# Patient Record
Sex: Female | Born: 1975 | Race: Black or African American | Hispanic: No | Marital: Married | Smoking: Never smoker
Health system: Southern US, Community
[De-identification: ages and names within clinical notes are randomized; demographics above are authoritative.]

## PROBLEM LIST (undated history)

## (undated) DIAGNOSIS — D219 Benign neoplasm of connective and other soft tissue, unspecified: Secondary | ICD-10-CM

## (undated) DIAGNOSIS — N63 Unspecified lump in unspecified breast: Secondary | ICD-10-CM

## (undated) DIAGNOSIS — N182 Chronic kidney disease, stage 2 (mild): Secondary | ICD-10-CM

## (undated) DIAGNOSIS — B379 Candidiasis, unspecified: Secondary | ICD-10-CM

## (undated) HISTORY — DX: Chronic kidney disease, stage 2 (mild): N18.2

## (undated) HISTORY — PX: WISDOM TOOTH EXTRACTION: SHX21

## (undated) HISTORY — DX: Candidiasis, unspecified: B37.9

---

## 2007-02-15 ENCOUNTER — Other Ambulatory Visit: Admission: RE | Admit: 2007-02-15 | Discharge: 2007-02-15 | Payer: Self-pay | Admitting: *Deleted

## 2011-08-18 ENCOUNTER — Encounter: Payer: Self-pay | Admitting: Obstetrics and Gynecology

## 2011-08-18 ENCOUNTER — Ambulatory Visit (INDEPENDENT_AMBULATORY_CARE_PROVIDER_SITE_OTHER): Payer: BC Managed Care – PPO | Admitting: Obstetrics and Gynecology

## 2011-08-18 VITALS — BP 92/66 | HR 82 | Ht 62.0 in | Wt 119.0 lb

## 2011-08-18 DIAGNOSIS — R102 Pelvic and perineal pain: Secondary | ICD-10-CM | POA: Insufficient documentation

## 2011-08-18 DIAGNOSIS — Z124 Encounter for screening for malignant neoplasm of cervix: Secondary | ICD-10-CM

## 2011-08-18 DIAGNOSIS — N949 Unspecified condition associated with female genital organs and menstrual cycle: Secondary | ICD-10-CM

## 2011-08-18 DIAGNOSIS — Z319 Encounter for procreative management, unspecified: Secondary | ICD-10-CM | POA: Insufficient documentation

## 2011-08-18 NOTE — Progress Notes (Signed)
Last Pap: 2011 per pt WNL: Yes Regular Periods:yes Contraception: none  Monthly Breast exam:yes Tetanus<104yrs:yes Nl.Bladder Function:yes Daily BMs:yes Healthy Diet:yes Calcium:no Mammogram:no Date of Mammogram: n/a Exercise:no Have often Exercise: n/a Seatbelt: yes Abuse at home: no Stressful work:yes Sigmoid-colonoscopy: n/a Bone Density: No PCP: Pt does not have one at this time Change in PMH: n/a Change in FMH:n/a Pt c/o left side pain before her period.  This pain occurs one to two weeks before her menses.  Pain is 8/10. advil decreases it to a 3/10.  She has also been trying for two years to conceive.  She is in good health.  Her husband had testicular cancer with one dose of chemo.  They have IC three times a week She would like to discuss PCOS and endometriosis.  BP 92/66  Pulse 82  Ht 5\' 2"  (1.575 m)  Wt 119 lb (53.978 kg)  BMI 21.77 kg/m2  LMP 08/02/2011 Pt without complaints Physical Examination: General appearance - alert, well appearing, and in no distress Mental status - normal mood, behavior, speech, dress, motor activity, and thought processes Neck - supple, no significant adenopathy, thyroid exam: thyroid is normal in size without nodules or tenderness Chest - clear to auscultation, no wheezes, rales or rhonchi, symmetric air entry Heart - normal rate and regular rhythm Abdomen - soft, nontender, nondistended, no masses or organomegaly Breasts - breasts appear normal, no suspicious masses, no skin or nipple changes or axillary nodes Pelvic - normal external genitalia, vulva, vagina, cervix, uterus and adnexa Rectal - normal rectal, no masses, rectal exam not indicated Back exam - full range of motion, no tenderness, palpable spasm or pain on motion Neurological - alert, oriented, normal speech, no focal findings or movement disorder noted Musculoskeletal - no joint tenderness, deformity or swelling Extremities - no edema, redness or tenderness in the calves  or thighs Skin - normal coloration and turgor, no rashes, no suspicious skin lesions noted Routine exam Pap sent yes with cultures Mammogram due n0 RT for shg/femvue.  Pt declined HSG.  Check day 21 labs.  Semen analysis ordered.  PNV samples given.   On day 21 check fasting blood sugars, insulin, tsh, prl and progesterone Pt agrees with the plan

## 2011-08-18 NOTE — Patient Instructions (Signed)
Call with menses to schedule sonohysterogram and Femvue Schedule fasting day 21 labs Give pt information on HSG

## 2011-08-19 LAB — PAP IG W/ RFLX HPV ASCU

## 2011-09-09 ENCOUNTER — Telehealth: Payer: Self-pay | Admitting: Obstetrics and Gynecology

## 2011-09-09 NOTE — Telephone Encounter (Signed)
Niccole/ND pt/appt req.

## 2014-10-23 ENCOUNTER — Other Ambulatory Visit (HOSPITAL_COMMUNITY)
Admission: RE | Admit: 2014-10-23 | Discharge: 2014-10-23 | Disposition: A | Discharge status: Home / Self Care | Payer: BLUE CROSS/BLUE SHIELD | Source: Ambulatory Visit | Attending: Obstetrics and Gynecology | Admitting: Obstetrics and Gynecology

## 2014-10-23 DIAGNOSIS — Z1151 Encounter for screening for human papillomavirus (HPV): Secondary | ICD-10-CM | POA: Diagnosis not present

## 2014-10-23 DIAGNOSIS — Z01419 Encounter for gynecological examination (general) (routine) without abnormal findings: Secondary | ICD-10-CM | POA: Diagnosis present

## 2015-01-14 NOTE — L&D Delivery Note (Signed)
Delivery Note At  a viable female was delivered via  vtx presentation..  APGAR:8 ,9 ; Placenta status: intact , .  3VCord:  .    Anesthesia:  Lidocaine Episiotomy:  None Lacerations:  1st degree, left labial, periurethral Suture Repair: vicryl Est. Blood Loss (mL):  100cc  Mom to postpartum.  Baby to Couplet care / Skin to Skin.  Kelly Suarez Kelly Suarez 10/23/2015, 9:24 PM

## 2015-03-29 LAB — OB RESULTS CONSOLE HEPATITIS B SURFACE ANTIGEN: Hepatitis B Surface Ag: NEGATIVE

## 2015-03-29 LAB — OB RESULTS CONSOLE HIV ANTIBODY (ROUTINE TESTING): HIV: NONREACTIVE

## 2015-03-29 LAB — OB RESULTS CONSOLE ANTIBODY SCREEN: Antibody Screen: NEGATIVE

## 2015-03-29 LAB — OB RESULTS CONSOLE RPR: RPR: NONREACTIVE

## 2015-03-29 LAB — OB RESULTS CONSOLE RUBELLA ANTIBODY, IGM: Rubella: IMMUNE

## 2015-03-29 LAB — OB RESULTS CONSOLE ABO/RH: RH Type: POSITIVE

## 2015-04-04 ENCOUNTER — Other Ambulatory Visit: Payer: Self-pay | Admitting: Obstetrics and Gynecology

## 2015-04-04 DIAGNOSIS — N6320 Unspecified lump in the left breast, unspecified quadrant: Secondary | ICD-10-CM

## 2015-04-04 DIAGNOSIS — O09522 Supervision of elderly multigravida, second trimester: Secondary | ICD-10-CM

## 2015-04-04 DIAGNOSIS — Z3689 Encounter for other specified antenatal screening: Secondary | ICD-10-CM

## 2015-04-09 ENCOUNTER — Ambulatory Visit
Admission: RE | Admit: 2015-04-09 | Discharge: 2015-04-09 | Disposition: A | Discharge status: Home / Self Care | Payer: BLUE CROSS/BLUE SHIELD | Source: Ambulatory Visit | Attending: Obstetrics and Gynecology | Admitting: Obstetrics and Gynecology

## 2015-04-09 DIAGNOSIS — N6320 Unspecified lump in the left breast, unspecified quadrant: Secondary | ICD-10-CM

## 2015-05-22 ENCOUNTER — Encounter (HOSPITAL_COMMUNITY): Payer: Self-pay | Admitting: Obstetrics and Gynecology

## 2015-06-01 ENCOUNTER — Encounter (HOSPITAL_COMMUNITY): Payer: Self-pay

## 2015-06-04 ENCOUNTER — Ambulatory Visit (HOSPITAL_COMMUNITY)
Admission: RE | Admit: 2015-06-04 | Discharge: 2015-06-04 | Disposition: A | Discharge status: Home / Self Care | Payer: BLUE CROSS/BLUE SHIELD | Source: Ambulatory Visit | Attending: Obstetrics and Gynecology | Admitting: Obstetrics and Gynecology

## 2015-06-04 ENCOUNTER — Other Ambulatory Visit: Payer: Self-pay | Admitting: Obstetrics and Gynecology

## 2015-06-04 ENCOUNTER — Other Ambulatory Visit (HOSPITAL_COMMUNITY): Payer: Self-pay | Admitting: *Deleted

## 2015-06-04 ENCOUNTER — Encounter (HOSPITAL_COMMUNITY): Payer: Self-pay

## 2015-06-04 DIAGNOSIS — O09522 Supervision of elderly multigravida, second trimester: Secondary | ICD-10-CM

## 2015-06-04 DIAGNOSIS — O09512 Supervision of elderly primigravida, second trimester: Secondary | ICD-10-CM | POA: Diagnosis present

## 2015-06-04 DIAGNOSIS — Z3A19 19 weeks gestation of pregnancy: Secondary | ICD-10-CM | POA: Diagnosis not present

## 2015-06-04 DIAGNOSIS — Z36 Encounter for antenatal screening of mother: Secondary | ICD-10-CM | POA: Insufficient documentation

## 2015-06-04 DIAGNOSIS — Z3689 Encounter for other specified antenatal screening: Secondary | ICD-10-CM

## 2015-06-04 DIAGNOSIS — O09519 Supervision of elderly primigravida, unspecified trimester: Secondary | ICD-10-CM

## 2015-07-03 ENCOUNTER — Ambulatory Visit (HOSPITAL_COMMUNITY): Payer: BLUE CROSS/BLUE SHIELD

## 2015-07-05 ENCOUNTER — Encounter (HOSPITAL_COMMUNITY): Payer: Self-pay

## 2015-07-05 ENCOUNTER — Other Ambulatory Visit (HOSPITAL_COMMUNITY): Payer: Self-pay | Admitting: Obstetrics and Gynecology

## 2015-07-05 ENCOUNTER — Ambulatory Visit (HOSPITAL_COMMUNITY)
Admission: RE | Admit: 2015-07-05 | Discharge: 2015-07-05 | Disposition: A | Discharge status: Home / Self Care | Payer: BLUE CROSS/BLUE SHIELD | Source: Ambulatory Visit | Attending: Obstetrics and Gynecology | Admitting: Obstetrics and Gynecology

## 2015-07-05 DIAGNOSIS — O09512 Supervision of elderly primigravida, second trimester: Secondary | ICD-10-CM | POA: Insufficient documentation

## 2015-07-05 DIAGNOSIS — Z36 Encounter for antenatal screening of mother: Secondary | ICD-10-CM | POA: Diagnosis not present

## 2015-07-05 DIAGNOSIS — Z3A23 23 weeks gestation of pregnancy: Secondary | ICD-10-CM | POA: Insufficient documentation

## 2015-07-05 DIAGNOSIS — O09519 Supervision of elderly primigravida, unspecified trimester: Secondary | ICD-10-CM

## 2015-08-02 ENCOUNTER — Ambulatory Visit (HOSPITAL_COMMUNITY): Payer: BLUE CROSS/BLUE SHIELD

## 2015-08-07 ENCOUNTER — Encounter (HOSPITAL_COMMUNITY): Payer: Self-pay

## 2015-08-08 ENCOUNTER — Ambulatory Visit (HOSPITAL_COMMUNITY)
Admission: RE | Admit: 2015-08-08 | Payer: BLUE CROSS/BLUE SHIELD | Source: Ambulatory Visit | Attending: Obstetrics and Gynecology | Admitting: Obstetrics and Gynecology

## 2015-08-30 ENCOUNTER — Ambulatory Visit (HOSPITAL_COMMUNITY): Admission: RE | Admit: 2015-08-30 | Payer: BLUE CROSS/BLUE SHIELD | Source: Ambulatory Visit

## 2015-09-21 ENCOUNTER — Observation Stay (HOSPITAL_COMMUNITY): Payer: BLUE CROSS/BLUE SHIELD

## 2015-09-21 ENCOUNTER — Observation Stay (HOSPITAL_COMMUNITY)
Admission: AD | Admit: 2015-09-21 | Discharge: 2015-09-22 | Disposition: A | Discharge status: Home / Self Care | Payer: BLUE CROSS/BLUE SHIELD | Source: Ambulatory Visit | Attending: Obstetrics and Gynecology | Admitting: Obstetrics and Gynecology

## 2015-09-21 ENCOUNTER — Encounter (HOSPITAL_COMMUNITY): Payer: Self-pay | Admitting: *Deleted

## 2015-09-21 ENCOUNTER — Other Ambulatory Visit: Payer: Self-pay | Admitting: Obstetrics and Gynecology

## 2015-09-21 DIAGNOSIS — O4693 Antepartum hemorrhage, unspecified, third trimester: Secondary | ICD-10-CM

## 2015-09-21 DIAGNOSIS — O4690 Antepartum hemorrhage, unspecified, unspecified trimester: Secondary | ICD-10-CM | POA: Diagnosis present

## 2015-09-21 DIAGNOSIS — Z3A34 34 weeks gestation of pregnancy: Secondary | ICD-10-CM | POA: Insufficient documentation

## 2015-09-21 LAB — URINALYSIS, ROUTINE W REFLEX MICROSCOPIC
BILIRUBIN URINE: NEGATIVE
GLUCOSE, UA: 250 mg/dL — AB
KETONES UR: NEGATIVE mg/dL
Nitrite: NEGATIVE
PH: 5.5 (ref 5.0–8.0)
PROTEIN: NEGATIVE mg/dL
Specific Gravity, Urine: 1.015 (ref 1.005–1.030)

## 2015-09-21 LAB — TYPE AND SCREEN
ABO/RH(D): B POS
Antibody Screen: NEGATIVE

## 2015-09-21 LAB — URINE MICROSCOPIC-ADD ON

## 2015-09-21 LAB — CBC
HEMATOCRIT: 37.4 % (ref 36.0–46.0)
Hemoglobin: 12.8 g/dL (ref 12.0–15.0)
MCH: 31.3 pg (ref 26.0–34.0)
MCHC: 34.2 g/dL (ref 30.0–36.0)
MCV: 91.4 fL (ref 78.0–100.0)
PLATELETS: 211 10*3/uL (ref 150–400)
RBC: 4.09 MIL/uL (ref 3.87–5.11)
RDW: 13.8 % (ref 11.5–15.5)
WBC: 8.4 10*3/uL (ref 4.0–10.5)

## 2015-09-21 LAB — GLUCOSE, CAPILLARY: GLUCOSE-CAPILLARY: 120 mg/dL — AB (ref 65–99)

## 2015-09-21 LAB — ABO/RH: ABO/RH(D): B POS

## 2015-09-21 MED ORDER — DOCUSATE SODIUM 100 MG PO CAPS
100.0000 mg | ORAL_CAPSULE | Freq: Every day | ORAL | Status: DC
  Administered 2015-09-22: 100 mg via ORAL
  Filled 2015-09-21: qty 1

## 2015-09-21 MED ORDER — CALCIUM CARBONATE ANTACID 500 MG PO CHEW: 2.0000 | CHEWABLE_TABLET | ORAL | Status: DC | PRN

## 2015-09-21 MED ORDER — BETAMETHASONE SOD PHOS & ACET 6 (3-3) MG/ML IJ SUSP
12.0000 mg | INTRAMUSCULAR | Status: AC
  Administered 2015-09-21 – 2015-09-22 (×2): 12 mg via INTRAMUSCULAR
  Filled 2015-09-21 (×2): qty 2

## 2015-09-21 MED ORDER — ACETAMINOPHEN 325 MG PO TABS: 650.0000 mg | ORAL_TABLET | ORAL | Status: DC | PRN

## 2015-09-21 MED ORDER — LACTATED RINGERS IV SOLN
INTRAVENOUS | Status: DC
  Administered 2015-09-21: 125 mL/h via INTRAVENOUS
  Administered 2015-09-22: 12:00:00 via INTRAVENOUS

## 2015-09-21 MED ORDER — ZOLPIDEM TARTRATE 5 MG PO TABS: 5.0000 mg | ORAL_TABLET | Freq: Every evening | ORAL | Status: DC | PRN

## 2015-09-21 MED ORDER — PRENATAL MULTIVITAMIN CH
1.0000 | ORAL_TABLET | Freq: Every day | ORAL | Status: DC
  Administered 2015-09-21: 1 via ORAL
  Filled 2015-09-21 (×2): qty 1

## 2015-09-21 MED ORDER — NIFEDIPINE 10 MG PO CAPS
10.0000 mg | ORAL_CAPSULE | Freq: Four times a day (QID) | ORAL | Status: DC | PRN
  Administered 2015-09-21 – 2015-09-22 (×2): 10 mg via ORAL
  Filled 2015-09-21 (×2): qty 1

## 2015-09-21 MED ORDER — NIFEDIPINE 10 MG PO CAPS: 10.0000 mg | ORAL_CAPSULE | Freq: Four times a day (QID) | ORAL | Status: DC

## 2015-09-21 MED ORDER — LACTATED RINGERS IV BOLUS (SEPSIS)
500.0000 mL | Freq: Once | INTRAVENOUS | Status: AC
  Administered 2015-09-21: 500 mL via INTRAVENOUS

## 2015-09-21 MED ORDER — NIFEDIPINE 10 MG PO CAPS: 20.0000 mg | ORAL_CAPSULE | Freq: Once | ORAL | Status: DC | PRN

## 2015-09-21 NOTE — H&P (Addendum)
Kelly Suarez is a 40 y.o. female G1 @ 44 5/7 weeks revised by a 9 week ultrasound admitted for presumed preterm labor.  Pt called the office this morning stating she had some spotting since about 4am. When she checked at 7 am she was still spotting but it was not as much at it was at 4am. Pt states that she has not had sex recently and baby is moving good.   Pt reports pelvic pressure but denies contractions.  Pt now states that when she goes to the bathroom in the hospital, she feels pain in the anterior to fundal portion of her uterus but it does not hurt on palpation. PNC has been complicated by preterm contractions in the second trimester.  Cervical length was normal.  Large uterine fibroids x 4.  Largest is anterior fundal at 10 cm, others are 4-5 cm x 2 and 2 cm.  Anterior fibroid noted in LUS. Pt is a Jehovah's Witness and refuses all blood products.  She has not had anemia this pregnancy.  OB History    Gravida Para Term Preterm AB Living   1 0 0 0 0 0   SAB TAB Ectopic Multiple Live Births   0 0 0 0 0     Past Medical History:  Diagnosis Date  . Yeast infection    Past Surgical History:  Procedure Laterality Date  . WISDOM TOOTH EXTRACTION     Family History: family history includes Asthma in her mother; Cancer in her maternal grandfather; Heart disease in her mother. Social History:  reports that she has never smoked. She has never used smokeless tobacco. She reports that she drinks about 0.5 oz of alcohol per week . She reports that she does not use drugs.     Maternal Diabetes: No Genetic Screening: Declined Maternal Ultrasounds/Referrals: Abnormal:  Findings:   Other: Large fibroids Fetal Ultrasounds or other Referrals:  Referred to Materal Fetal Medicine  Maternal Substance Abuse:  No Significant Maternal Medications:  None Significant Maternal Lab Results:  None Other Comments:  Jehovah's Witness  Review of Systems  Constitutional: Negative for chills and fever.    Maternal Medical History:  Reason for admission: Contractions and vaginal bleeding.   Contractions: Frequency: irregular.   Pt does feel contractions just pressure  Fetal activity: Perceived fetal activity is normal.    Prenatal complications: Preterm contractions in second trimester.Fibroids. Extreme AMA.  Prenatal Complications - Diabetes: none.      Blood pressure 107/66, pulse 90, temperature 98.1 F (36.7 C), temperature source Oral, resp. rate 20, height 5\' 2"  (1.575 m), weight 68 kg (150 lb). Maternal Exam:  Uterine Assessment: Contraction frequency is irregular.   Abdomen: Patient reports no abdominal tenderness. Estimated fetal weight is 8/31- 4 lbs 14 oz +/- 8 oz (2225 grams) 58 %.   Fetal presentation: vertex  Introitus: Normal vulva. Large blood clot/mucus plug.  No active cervical bleeding  Pelvis: adequate for delivery.   Cervix: Cervix evaluated by digital exam.   1-2/50/-3, engaged, soft, midposition  Fetal Exam Fetal Monitor Review: Variability: moderate (6-25 bpm).   Pattern: accelerations present and variable decelerations.    Fetal State Assessment: Category II - tracings are indeterminate.     Physical Exam  Constitutional: She is oriented to person, place, and time. She appears well-developed and well-nourished.  HENT:  Head: Normocephalic and atraumatic.  Eyes: EOM are normal.  Neck: Normal range of motion.  Respiratory: Effort normal.  Musculoskeletal: Normal range of motion. She exhibits  no edema or tenderness.  Neurological: She is alert and oriented to person, place, and time.  Skin: Skin is warm and dry.  Psychiatric: She has a normal mood and affect.    Prenatal labs: ABO, Rh: --/--/B POS, B POS (09/08 1440) Antibody: NEG (09/08 1440) Rubella: Immune (03/16 0000) RPR: Nonreactive (03/16 0000)  HBsAg: Negative (03/16 0000)  HIV: Non-reactive (03/16 0000)  GBS:   Pending  Urinalysis    Component Value Date/Time   COLORURINE  YELLOW 09/21/2015 [place] 09/21/2015 1545   LABSPEC 1.015 09/21/2015 1545   PHURINE 5.5 09/21/2015 1545   GLUCOSEU 250 (A) 09/21/2015 1545   HGBUR TRACE (A) 09/21/2015 1545   BILIRUBINUR NEGATIVE 09/21/2015 [place] 09/21/2015 1545   PROTEINUR NEGATIVE 09/21/2015 1545   NITRITE NEGATIVE 09/21/2015 1545   LEUKOCYTESUR SMALL (A) 09/21/2015 1545     Assessment/Plan: IUP @ 34 5/7 weeks Category II tracing overall reassuring. Presumed PTL. Vaginal bleeding. Glucosuria- Fibroids Extreme AMA. Jehovah's Witness.  Normal hemoglobin.  Pt admitted for 24 hour observation.  IVF bolus.  S/p Procardia. BMZ administered at 1711. Continue Procardia 10 mg q 6 prn contractions.   BPP, assess fluid. GBS by PCR ordered. CBG 120 with meal.  Repeat 2 hours postprandial.  If contractions decreased, cervix unchanged discharge home with Procardia after 2nd Bethamethasone injection.  Pt previously counseled on risk of bleeding with vaginal delivery or cesarean section.  Understands risk of death if hemorrhage with severe anemia requiring a blood transfusion.  Pt has been well counseled by her clergy and verbalizes understanding.  CCOB to cover this weekend.  Dr. Raphael Gibney assuming care at 7 pm.     Thurnell Lose 09/21/2015, 6:37 PM   Addendum: [place] after admission:  Vtx, EFW 5+14, 74%ile, AFI 14.74, 53%ile, multiple fibroids, BPP 8/8.  Davis Gourd 09/22/15 7:15a

## 2015-09-22 LAB — OB RESULTS CONSOLE GC/CHLAMYDIA
Chlamydia: NEGATIVE
GC PROBE AMP, GENITAL: NEGATIVE

## 2015-09-22 LAB — GROUP B STREP BY PCR: Group B strep by PCR: NEGATIVE

## 2015-09-22 LAB — OB RESULTS CONSOLE GBS: STREP GROUP B AG: NEGATIVE

## 2015-09-22 MED ORDER — NIFEDIPINE 10 MG PO CAPS: 10.0000 mg | ORAL_CAPSULE | Freq: Four times a day (QID) | ORAL | 1 refills | Status: DC

## 2015-09-22 MED ORDER — NIFEDIPINE 10 MG PO CAPS
10.0000 mg | ORAL_CAPSULE | Freq: Four times a day (QID) | ORAL | Status: DC
  Administered 2015-09-22 (×2): 10 mg via ORAL
  Filled 2015-09-22 (×2): qty 1

## 2015-09-22 NOTE — Discharge Summary (Signed)
  ANTENATAL DISCHARGE SUMMARY  Patient ID: Kelly Suarez MRN: [id number] DOB/AGE: 41/07/1975 41 y.o.  Admit date: 09/21/2015 Discharge date: 09/22/2015  Admission Diagnoses: 34+6 weeks, large fibroids, preterm labor  Discharge Diagnoses: same with 2nd bleeding         Discharged Condition: good  Hospital Course: Admitted 09/21/15, given BMZ x2 and Procardia every 6 hours. Bleeding stopped rapidly and perceived contractions resolved. Cervical exam upon discharge 1/50/balottable and posterior   Disposition: home. Follow-up appointment 09/25/15 at [facility]    Medication List    TAKE these medications   NIFEdipine 10 MG capsule Commonly known as:  PROCARDIA Take 1 capsule (10 mg total) by mouth every 6 (six) hours.   prenatal vitamin w/FE, FA 27-1 MG Tabs tablet Take 1 tablet by mouth every evening.        Signed: Alwyn Pea, MD MD 09/22/2015, 6:16 PM

## 2015-09-22 NOTE — Progress Notes (Signed)
Patient resting at this time. Per MD orders, patient vitals not obtained at this time. Gildardo Cranker, RN

## 2015-09-22 NOTE — Discharge Instructions (Signed)
Preterm Labor Information Preterm labor is when labor starts before you are [redacted] weeks pregnant. The normal length of pregnancy is 39 to 41 weeks.  CAUSES  The cause of preterm labor is not often known. The most common known cause is infection. RISK FACTORS  Having a history of preterm labor.  Having your water break before it should.  Having a placenta that covers the opening of the cervix.  Having a placenta that breaks away from the uterus.  Having a cervix that is too weak to hold the baby in the uterus.  Having too much fluid in the amniotic sac.  Taking drugs or smoking while pregnant.  Not gaining enough weight while pregnant.  Being younger than 38 and older than 41 years old.  Having a low income.  Being African American. SYMPTOMS  Period-like cramps, belly (abdominal) pain, or back pain.  Contractions that are regular, as often as six in an hour. They may be mild or painful.  Contractions that start at the top of the belly. They then move to the lower belly and back.  Lower belly pressure that seems to get stronger.  Bleeding from the vagina.  Fluid leaking from the vagina. TREATMENT  Treatment depends on:  Your condition.  The condition of your baby.  How many weeks pregnant you are. Your doctor may have you:  Take medicine to stop contractions.  Stay in bed except to use the restroom (bed rest).  Stay in the hospital. WHAT SHOULD YOU DO IF YOU THINK YOU ARE IN PRETERM LABOR? Call your doctor right away. You need to go to the hospital right away.  HOW CAN YOU PREVENT PRETERM LABOR IN FUTURE PREGNANCIES?  Stop smoking, if you smoke.  Maintain healthy weight gain.  Do not take drugs or be around chemicals that are not needed.  Tell your doctor if you think you have an infection.  Tell your doctor if you had a preterm labor before.   This information is not intended to replace advice given to you by your health care provider. Make sure you  discuss any questions you have with your health care provider.   Document Released: 03/28/2008 Document Revised: 05/16/2014 Document Reviewed: 02/02/2012 Elsevier Interactive Patient Education [employer].

## 2015-09-22 NOTE — Progress Notes (Signed)
Hospital day # 1 pregnancy at [redacted]w[redacted]d with PTL / large fibroids  S: well, reports good fetal activity      Contractions:none perceived by patient but every 2-4 minutes on toco. Patient reports great improvement compared to yesterday with resolution of bleeding and pelvic pressure.      Vaginal bleeding:none now       Vaginal discharge: no significant change  O: BP 102/65 (BP Location: Left Arm)   Pulse 70   Temp 97.7 F (36.5 C) (Oral)   Resp 16   Ht 5\' 2"  (1.575 m)   Wt 150 lb (68 kg)   BMI 27.44 kg/m       Fetal tracings:reviewed and reassuring      Uterus gravid and non-tender      Extremities: no significant edema and no signs of DVT  A: [redacted]w[redacted]d with PTL: improved      Large uterine fibroids     P: continue current plan of care     Change Procardia to every 6 hours. BMZ#2 at 17:00  Akosua Constantine A  MD 09/22/2015 10:24 AM

## 2015-09-27 ENCOUNTER — Ambulatory Visit (HOSPITAL_COMMUNITY): Admission: RE | Admit: 2015-09-27 | Payer: BLUE CROSS/BLUE SHIELD | Source: Ambulatory Visit

## 2015-10-23 ENCOUNTER — Inpatient Hospital Stay (HOSPITAL_COMMUNITY)
Admission: AD | Admit: 2015-10-23 | Discharge: 2015-10-25 | DRG: 775 | Disposition: A | Discharge status: Home / Self Care | Payer: BLUE CROSS/BLUE SHIELD | Source: Ambulatory Visit | Attending: Obstetrics and Gynecology | Admitting: Obstetrics and Gynecology

## 2015-10-23 ENCOUNTER — Encounter (HOSPITAL_COMMUNITY): Payer: Self-pay | Admitting: *Deleted

## 2015-10-23 DIAGNOSIS — Z8249 Family history of ischemic heart disease and other diseases of the circulatory system: Secondary | ICD-10-CM | POA: Diagnosis not present

## 2015-10-23 DIAGNOSIS — O3413 Maternal care for benign tumor of corpus uteri, third trimester: Secondary | ICD-10-CM | POA: Diagnosis present

## 2015-10-23 DIAGNOSIS — Z3A39 39 weeks gestation of pregnancy: Secondary | ICD-10-CM

## 2015-10-23 DIAGNOSIS — D259 Leiomyoma of uterus, unspecified: Secondary | ICD-10-CM | POA: Diagnosis present

## 2015-10-23 DIAGNOSIS — O09513 Supervision of elderly primigravida, third trimester: Secondary | ICD-10-CM

## 2015-10-23 DIAGNOSIS — Z3403 Encounter for supervision of normal first pregnancy, third trimester: Secondary | ICD-10-CM | POA: Diagnosis present

## 2015-10-23 HISTORY — DX: Unspecified lump in unspecified breast: N63.0

## 2015-10-23 HISTORY — DX: Benign neoplasm of connective and other soft tissue, unspecified: D21.9

## 2015-10-23 LAB — TYPE AND SCREEN
ABO/RH(D): B POS
Antibody Screen: NEGATIVE

## 2015-10-23 LAB — CBC
HCT: 39.2 % (ref 36.0–46.0)
Hemoglobin: 13.8 g/dL (ref 12.0–15.0)
MCH: 31.9 pg (ref 26.0–34.0)
MCHC: 35.2 g/dL (ref 30.0–36.0)
MCV: 90.7 fL (ref 78.0–100.0)
PLATELETS: 199 10*3/uL (ref 150–400)
RBC: 4.32 MIL/uL (ref 3.87–5.11)
RDW: 13.5 % (ref 11.5–15.5)
WBC: 9.4 10*3/uL (ref 4.0–10.5)

## 2015-10-23 MED ORDER — OXYCODONE-ACETAMINOPHEN 5-325 MG PO TABS: 2.0000 | ORAL_TABLET | ORAL | Status: DC | PRN

## 2015-10-23 MED ORDER — OXYCODONE-ACETAMINOPHEN 5-325 MG PO TABS: 1.0000 | ORAL_TABLET | ORAL | Status: DC | PRN

## 2015-10-23 MED ORDER — PHENYLEPHRINE 40 MCG/ML (10ML) SYRINGE FOR IV PUSH (FOR BLOOD PRESSURE SUPPORT)
80.0000 ug | PREFILLED_SYRINGE | INTRAVENOUS | Status: DC | PRN
  Filled 2015-10-23: qty 5

## 2015-10-23 MED ORDER — DIPHENHYDRAMINE HCL 50 MG/ML IJ SOLN: 12.5000 mg | INTRAMUSCULAR | Status: DC | PRN

## 2015-10-23 MED ORDER — OXYTOCIN BOLUS FROM INFUSION
500.0000 mL | Freq: Once | INTRAVENOUS | Status: AC
  Administered 2015-10-23: 500 mL/h via INTRAVENOUS

## 2015-10-23 MED ORDER — OXYTOCIN 40 UNITS IN LACTATED RINGERS INFUSION - SIMPLE MED
2.5000 [IU]/h | INTRAVENOUS | Status: DC
  Administered 2015-10-23: 2.5 [IU]/h via INTRAVENOUS

## 2015-10-23 MED ORDER — ONDANSETRON HCL 4 MG PO TABS: 4.0000 mg | ORAL_TABLET | ORAL | Status: DC | PRN

## 2015-10-23 MED ORDER — LACTATED RINGERS IV SOLN: 500.0000 mL | Freq: Once | INTRAVENOUS | Status: DC

## 2015-10-23 MED ORDER — PRENATAL PLUS 27-1 MG PO TABS: 1.0000 | ORAL_TABLET | Freq: Every evening | ORAL | Status: DC

## 2015-10-23 MED ORDER — SENNOSIDES-DOCUSATE SODIUM 8.6-50 MG PO TABS
2.0000 | ORAL_TABLET | ORAL | Status: DC
  Administered 2015-10-24 (×2): 2 via ORAL
  Filled 2015-10-23 (×2): qty 2

## 2015-10-23 MED ORDER — SIMETHICONE 80 MG PO CHEW: 80.0000 mg | CHEWABLE_TABLET | ORAL | Status: DC | PRN

## 2015-10-23 MED ORDER — EPHEDRINE 5 MG/ML INJ
10.0000 mg | INTRAVENOUS | Status: DC | PRN
  Filled 2015-10-23: qty 4

## 2015-10-23 MED ORDER — TETANUS-DIPHTH-ACELL PERTUSSIS 5-2.5-18.5 LF-MCG/0.5 IM SUSP
0.5000 mL | Freq: Once | INTRAMUSCULAR | Status: AC
  Administered 2015-10-24: 0.5 mL via INTRAMUSCULAR

## 2015-10-23 MED ORDER — ONDANSETRON HCL 4 MG/2ML IJ SOLN: 4.0000 mg | INTRAMUSCULAR | Status: DC | PRN

## 2015-10-23 MED ORDER — OXYTOCIN 40 UNITS IN LACTATED RINGERS INFUSION - SIMPLE MED: 1.0000 m[IU]/min | INTRAVENOUS | Status: DC

## 2015-10-23 MED ORDER — LACTATED RINGERS IV SOLN
INTRAVENOUS | Status: DC
  Administered 2015-10-23 (×3): via INTRAVENOUS

## 2015-10-23 MED ORDER — DIPHENHYDRAMINE HCL 25 MG PO CAPS: 25.0000 mg | ORAL_CAPSULE | Freq: Four times a day (QID) | ORAL | Status: DC | PRN

## 2015-10-23 MED ORDER — SOD CITRATE-CITRIC ACID 500-334 MG/5ML PO SOLN: 30.0000 mL | ORAL | Status: DC | PRN

## 2015-10-23 MED ORDER — COCONUT OIL OIL: 1.0000 "application " | TOPICAL_OIL | Status: DC | PRN

## 2015-10-23 MED ORDER — FENTANYL CITRATE (PF) 100 MCG/2ML IJ SOLN: 50.0000 ug | INTRAMUSCULAR | Status: DC | PRN

## 2015-10-23 MED ORDER — BENZOCAINE-MENTHOL 20-0.5 % EX AERO: 1.0000 "application " | INHALATION_SPRAY | CUTANEOUS | Status: DC | PRN

## 2015-10-23 MED ORDER — LACTATED RINGERS IV SOLN: INTRAVENOUS | Status: DC

## 2015-10-23 MED ORDER — ACETAMINOPHEN 325 MG PO TABS
650.0000 mg | ORAL_TABLET | ORAL | Status: DC | PRN
  Filled 2015-10-23 (×2): qty 2

## 2015-10-23 MED ORDER — ONDANSETRON HCL 4 MG/2ML IJ SOLN: 4.0000 mg | Freq: Four times a day (QID) | INTRAMUSCULAR | Status: DC | PRN

## 2015-10-23 MED ORDER — TERBUTALINE SULFATE 1 MG/ML IJ SOLN
0.2500 mg | Freq: Once | INTRAMUSCULAR | Status: DC | PRN
  Filled 2015-10-23: qty 1

## 2015-10-23 MED ORDER — FENTANYL 2.5 MCG/ML BUPIVACAINE 1/10 % EPIDURAL INFUSION (WH - ANES): 14.0000 mL/h | INTRAMUSCULAR | Status: DC | PRN

## 2015-10-23 MED ORDER — PRENATAL MULTIVITAMIN CH: 1.0000 | ORAL_TABLET | Freq: Every day | ORAL | Status: DC

## 2015-10-23 MED ORDER — IBUPROFEN 600 MG PO TABS
600.0000 mg | ORAL_TABLET | Freq: Four times a day (QID) | ORAL | Status: DC
  Administered 2015-10-25: 600 mg via ORAL
  Filled 2015-10-23: qty 1

## 2015-10-23 MED ORDER — LIDOCAINE HCL (PF) 1 % IJ SOLN
30.0000 mL | INTRAMUSCULAR | Status: DC | PRN
  Administered 2015-10-23: 30 mL via SUBCUTANEOUS
  Filled 2015-10-23: qty 30

## 2015-10-23 MED ORDER — ZOLPIDEM TARTRATE 5 MG PO TABS: 5.0000 mg | ORAL_TABLET | Freq: Every evening | ORAL | Status: DC | PRN

## 2015-10-23 MED ORDER — OXYTOCIN 40 UNITS IN LACTATED RINGERS INFUSION - SIMPLE MED
1.0000 m[IU]/min | INTRAVENOUS | Status: DC
  Administered 2015-10-23: 1 m[IU]/min via INTRAVENOUS
  Filled 2015-10-23: qty 1000

## 2015-10-23 MED ORDER — LACTATED RINGERS IV SOLN
500.0000 mL | INTRAVENOUS | Status: DC | PRN
  Administered 2015-10-23: 1000 mL via INTRAVENOUS

## 2015-10-23 MED ORDER — ACETAMINOPHEN 325 MG PO TABS
650.0000 mg | ORAL_TABLET | ORAL | Status: DC | PRN
  Administered 2015-10-24 (×2): 650 mg via ORAL

## 2015-10-23 NOTE — Progress Notes (Signed)
Kelly Suarez is a 40 y.o. G1P0000 at [redacted]w[redacted]d   Subjective: Pt states contractions are getting stronger.  Denies watery discharge.  Objective: BP 130/86   Pulse 81   Temp 98.4 F (36.9 C) (Oral)   Resp 18   Ht 5\' 2"  (1.575 m)   Wt 68.9 kg (152 lb)   BMI 27.80 kg/m  No intake/output data recorded. No intake/output data recorded.  FHT:  FHR: 130s bpm, variability: min-mod,  accelerations:  Present,  decelerations:  Absent UC:   irregular, every 6-8 minutes.  Pt has been on the ball.  SVE:   Dilation: 6 Effacement (%): 90 Station: -2 Exam by:: Dr. Simona Huh  Bag palpated.  Labs: Lab Results  Component Value Date   WBC 9.4 10/23/2015   HGB 13.8 10/23/2015   HCT 39.2 10/23/2015   MCV 90.7 10/23/2015   PLT 199 10/23/2015    Assessment / Plan: IUP @ 39 2/7 weeks Protracted labor now on Pitocin.  Increasing by 1 milliunit per pt request. Defer AROM.  Pt desires SROM unless she is not progressing at next check. Jehovah's Witness.  Hg normal.  Labor: See above. Preeclampsia:  BP normal. Fetal Wellbeing:  Category I Pain Control:  Labor support without medications I/D:  n/a Anticipated MOD:  NSVD   Dr. Landry Mellow will assume care at this time.  Pt aware.  Thurnell Lose 10/23/2015, 3:51 PM

## 2015-10-23 NOTE — H&P (Addendum)
Pt seen at 8:30 am.  Late entry. Kelly Suarez is a 40 y.o. female G1 @ 39 2/7 weeks revised by a 9 weeks ultrasound presenting for labor.  Pt was 4-5 cm in the office yesterday when membranes were stripped. Pt reports contractions started at 3 am.  Denies LOF , VB.  Very active fetus.  Pt has had early prenatal care with [facility] Simona Huh).  Pregnancy complicated by AMA (declined genetic testing), multiple uterine fibroids x 4 (3 -10 cm).  Pt also went into PTL at 34 weeks, BMZ administered.   Pt is a Jehovah's Witness.  Has had counseling by clergy and will refuse all blood products.  Pt has been counseled extensively on risk of bleeding with labor, delivery and cesarean section if needed. Accepts risk of hysterectomy if needed to manage bleeding.  OB History    Gravida Para Term Preterm AB Living   1 0 0 0 0 0   SAB TAB Ectopic Multiple Live Births   0 0 0 0 0     Past Medical History:  Diagnosis Date  . Breast mass   . Fibroid    X 5  . Yeast infection    Past Surgical History:  Procedure Laterality Date  . WISDOM TOOTH EXTRACTION     Family History: family history includes Asthma in her mother; Cancer in her maternal grandfather; Heart disease in her mother. Social History:  reports that she has never smoked. She has never used smokeless tobacco. She reports that she drinks about 0.5 oz of alcohol per week . She reports that she does not use drugs.     Maternal Diabetes: No Genetic Screening: Declined Maternal Ultrasounds/Referrals: Normal Fetal Ultrasounds or other Referrals:  None Maternal Substance Abuse:  No Significant Maternal Medications:  None Significant Maternal Lab Results:  Lab values include: Group B Strep negative Other Comments:  None  Review of Systems  Gastrointestinal: Positive for abdominal pain.   Maternal Medical History:  Reason for admission: Contractions.   Contractions: Onset was 3-5 hours ago.   Perceived severity is moderate.    Fetal  activity: Perceived fetal activity is normal.   Last perceived fetal movement was within the past hour.    Prenatal complications: Preterm labor.   Multiple uterine fibroids ranging from 2 cm to 10 cm.  Prenatal Complications - Diabetes: none.     Cervix 5-6/90/-3 VS WNL.  Maternal Exam:  Uterine Assessment: Contraction strength is mild.  Contraction frequency is irregular.   Abdomen: Patient reports no abdominal tenderness. Estimated fetal weight is Ultrasound 10/17/15-6 lbs 9 oz.    Introitus: Ferning test: not done.   Pelvis: adequate for delivery.   Cervix: Cervix evaluated by digital exam.     Fetal Exam Fetal Monitor Review: Mode: fetoscope.   Baseline rate: 130s.  Variability: moderate (6-25 bpm).   Pattern: accelerations present and no decelerations.    Fetal State Assessment: Category I - tracings are normal.     Physical Exam  Constitutional: She is oriented to person, place, and time. She appears well-developed and well-nourished.  HENT:  Head: Normocephalic and atraumatic.  Eyes: EOM are normal.  Neck: Normal range of motion.  Respiratory: Effort normal. No respiratory distress.  GI: There is no tenderness.  Musculoskeletal: Normal range of motion. She exhibits no edema or tenderness.  Neurological: She is alert and oriented to person, place, and time.  Skin: Skin is warm and dry.  Psychiatric: She has a normal mood and affect.  Prenatal labs: ABO, Rh: --/--/B POS (10/10 0950) Antibody: NEG (10/10 0950) Rubella: Immune (03/16 0000) RPR: Nonreactive (03/16 0000)  HBsAg: Negative (03/16 0000)  HIV: Non-reactive (03/16 0000)  GBS: Negative (09/09 0000)   Assessment/Plan: IUP @ 39 2/7 weeks Labor. Uterine fibroids,  Previously in LUS. AMA Jehovah's Witness.  Not anemic.  Admit for expectant management.  Contractions are infrequent.  If no cervical change, start low dose Pitocin.  Pt counseled.  All questions answered. Pt has a doula and  desires minimal intervention.  Fentanyl, NO and epidural prn. F/u CBC.    Thurnell Lose 10/23/2015, 3:13 PM

## 2015-10-23 NOTE — Progress Notes (Addendum)
Kelly Suarez is a 40 y.o. G1P0000 at [redacted]w[redacted]d by ultrasound admitted for active labor  Subjective: Pt reports contractions are 8 out of 10.  Objective: BP 112/64   Pulse (!) 104   Temp 98.3 F (36.8 C) (Oral)   Resp 18   Ht 5\' 2"  (1.575 m)   Wt 68.9 kg (152 lb)   BMI 27.80 kg/m  No intake/output data recorded. No intake/output data recorded.  FHT:  FHR: 140 bpm, variability: moderate,  accelerations:  Abscent,  decelerations:  Absent UC:   regular, every 3 minutes SVE:  8/90/0 membranes intact   Labs: Lab Results  Component Value Date   WBC 9.4 10/23/2015   HGB 13.8 10/23/2015   HCT 39.2 10/23/2015   MCV 90.7 10/23/2015   PLT 199 10/23/2015    Assessment / Plan: Protracted active phase  Labor: progressing on pitocin.. per nurse pt had SROM of clear fluid shortly after above surgical check.  Preeclampsia:  NA Fetal Wellbeing:  Category I Pain Control:  Labor support without medications I/D:  n/a Anticipated MOD:  NSVD [facility] MIdwife Irene Shipper and Dr. Cletis Media Assuming care at 7 pm this evening.  Ernan Runkles J. 10/23/2015, 6:47 PM

## 2015-10-23 NOTE — Progress Notes (Signed)
At Pt's request monitors were removed. Verbal order from MD to monitor every 15 minutes. Shift Change RN checked cervix of 9.5 lip/rim 100 and +2 station. CNM notified. CNM at bedside at @1941 . Pushing with ctx. RN manually holding [place] for FHR intermittently.

## 2015-10-23 NOTE — MAU Note (Addendum)
Dr. Simona Huh called in inquiring of pt room status. Dr. Simona Huh requests that patient be re-examined in 2 hours and report called to her. OK for pt to have saline lock. Copies of advanced directive, etc on chart. Patient has signed blood/blood product refusal. Birth plan on chart.

## 2015-10-23 NOTE — MAU Note (Signed)
Membranes stripped in office yesterday. Contractions during the night.

## 2015-10-23 NOTE — Anesthesia Pain Management Evaluation Note (Signed)
  CRNA Pain Management Visit Note  Patient: Kelly Suarez, 40 y.o., female  "Hello I am a member of the anesthesia team at [facility] We have an anesthesia team available at all times to provide care throughout the hospital, including epidural management and anesthesia for C-section. I don't know your plan for the delivery whether it a natural birth, water birth, IV sedation, nitrous supplementation, doula or epidural, but we want to meet your pain goals."   1.Was your pain managed to your expectations on prior hospitalizations?   No prior hospitalizations  2.What is your expectation for pain management during this hospitalization?     Labor support without medications  3.How can we help you reach that goal? unsure  Record the patient's initial score and the patient's pain goal.   Pain: 4  Pain Goal: 8 [facility] wants you to be able to say your pain was always managed very well.  Casimer Lanius 10/23/2015

## 2015-10-24 LAB — CBC
HCT: 36.5 % (ref 36.0–46.0)
Hemoglobin: 13.1 g/dL (ref 12.0–15.0)
MCH: 32.1 pg (ref 26.0–34.0)
MCHC: 35.9 g/dL (ref 30.0–36.0)
MCV: 89.5 fL (ref 78.0–100.0)
PLATELETS: 189 10*3/uL (ref 150–400)
RBC: 4.08 MIL/uL (ref 3.87–5.11)
RDW: 13.5 % (ref 11.5–15.5)
WBC: 13.3 10*3/uL — ABNORMAL HIGH (ref 4.0–10.5)

## 2015-10-24 LAB — RPR: RPR Ser Ql: NONREACTIVE

## 2015-10-24 LAB — HIV ANTIBODY (ROUTINE TESTING W REFLEX): HIV Screen 4th Generation wRfx: NONREACTIVE

## 2015-10-24 NOTE — Lactation Note (Signed)
This note was copied from a baby's chart. Lactation Consultation Note  Patient Name: Kelly Suarez [id number] Date: 10/24/2015 Reason for consult: Follow-up assessment Baby at 25 hr of life. Mom is reporting bilateral sore nipples. Both nipples appear dark pink on the nipple surface. Given comfort gels. Parents are worried because baby was sleeping all day and has been fussy for the last couple of hours. Discussed baby behavior, feeding frequency, baby belly size, voids, wt loss, breast changes, and nipple care. Demonstrated manual expression, colostrum noted bilaterally, spoon in room. Mom will offer the breast on demand 8+/24hr, if baby seems unsettled after bf she will manually express and spoon feed. She is aware of lactation services and support group. She will call as needed.      Maternal Data    Feeding Feeding Type: Breast Fed Length of feed: 20 min  LATCH Score/Interventions Latch: Repeated attempts needed to sustain latch, nipple held in mouth throughout feeding, stimulation needed to elicit sucking reflex. Intervention(s): Adjust position;Breast compression  Audible Swallowing: A few with stimulation Intervention(s): Hand expression;Skin to skin Intervention(s): Alternate breast massage  Type of Nipple: Everted at rest and after stimulation  Comfort (Breast/Nipple): Filling, red/small blisters or bruises, mild/mod discomfort  Problem noted: Mild/Moderate discomfort;Cracked, bleeding, blisters, bruises Interventions  (Cracked/bleeding/bruising/blister): Expressed breast milk to nipple Interventions (Mild/moderate discomfort): Comfort gels  Hold (Positioning): Assistance needed to correctly position infant at breast and maintain latch. Intervention(s): Support Pillows;Position options  LATCH Score: 6  Lactation Tools Discussed/Used     Consult Status Consult Status: Follow-up Date: 10/25/15 Follow-up type: In-patient    Denzil Hughes 10/24/2015,  10:54 PM

## 2015-10-24 NOTE — Lactation Note (Signed)
This note was copied from a baby's chart. Lactation Consultation Note New mom plans to BF for 1 1/2 yr exclusively. Mom has round breast w/everted short shaft nipples. Baby needs flange adjusted to obtain deeper latch w/chin tug. Hand expression taught w/much stimulation to see glisten. Assisted in football position. Referred to Baby and Me Book in Breastfeeding section Pg. 22-23 for position options and Proper latch demonstration.Mom encouraged to feed baby 8-12 times/24 hours and with feeding cues.  Educated about newborn behavior, STS, I&O, cluster feeding, supply and demand. Answered questions mom had. Jaconita brochure given w/resources, support groups and [place] services. Patient Name: Kelly Suarez [id number] Date: 10/24/2015 Reason for consult: Initial assessment   Maternal Data Has patient been taught Hand Expression?: Yes Does the patient have breastfeeding experience prior to this delivery?: No  Feeding Feeding Type: Breast Fed Length of feed: 20 min  LATCH Score/Interventions Latch: Repeated attempts needed to sustain latch, nipple held in mouth throughout feeding, stimulation needed to elicit sucking reflex. Intervention(s): Adjust position;Assist with latch;Breast massage;Breast compression  Audible Swallowing: None Intervention(s): Skin to skin Intervention(s): Skin to skin;Hand expression  Type of Nipple: Everted at rest and after stimulation  Comfort (Breast/Nipple): Soft / non-tender     Hold (Positioning): Assistance needed to correctly position infant at breast and maintain latch. Intervention(s): Breastfeeding basics reviewed;Support Pillows;Position options;Skin to skin  LATCH Score: 6  Lactation Tools Discussed/Used WIC Program: No   Consult Status Consult Status: Follow-up Date: 10/24/15 (in pm) Follow-up type: In-patient    Danyell Awbrey, Elta Guadeloupe 10/24/2015, 7:01 AM

## 2015-10-24 NOTE — Progress Notes (Signed)
Postpartum day #1, NSVD  Subjective Pt without complaints.  Lochia normal.  Pain controlled.  Breast feeding yes.  Temp:  [97.7 F (36.5 C)-98.8 F (37.1 C)] 98.2 F (36.8 C) (10/11 1130) Pulse Rate:  [71-104] 78 (10/11 1130) Resp:  [18-20] 18 (10/11 1130) BP: (102-130)/(58-86) 106/58 (10/11 1130) SpO2:  [98 %-99 %] 99 % (10/11 0405)  Gen:  NAD, A&O x 3 Uterine fundus:  Firm, nontender Lochia normal Ext:  No Edema, no calf tenderness bilaterally  CBC    Component Value Date/Time   WBC 13.3 (H) 10/24/2015 0508   RBC 4.08 10/24/2015 0508   HGB 13.1 10/24/2015 0508   HCT 36.5 10/24/2015 0508   PLT 189 10/24/2015 0508   MCV 89.5 10/24/2015 0508   MCH 32.1 10/24/2015 0508   MCHC 35.9 10/24/2015 0508   RDW 13.5 10/24/2015 0508     A/P: S/p SVD doing well. Routine postpartum care. Jehovah's Witness-  Hg great, minimal bleeding. Lactation support. Discharge in am.  Kelly Suarez 10/24/2015, 1:10 PM

## 2015-10-25 MED ORDER — IBUPROFEN 600 MG PO TABS: 600.0000 mg | ORAL_TABLET | Freq: Four times a day (QID) | ORAL | 1 refills | Status: DC

## 2015-10-25 NOTE — Discharge Instructions (Signed)

## 2015-10-25 NOTE — Discharge Summary (Signed)
OB Discharge Summary     Patient Name: Kelly Suarez DOB: June 21, 1975 MRN: [id number]  Date of admission: 10/23/2015 Delivering MD: Starla Link   Date of discharge: 10/25/2015  Admitting diagnosis: 40WKS,LABOR Intrauterine pregnancy: [redacted]w[redacted]d     Secondary diagnosis:  Active Problems:   Supervision of elderly primigravida (>=41 years old at delivery), third trimester  Additional problems: Fibroids, Jehovah's Witness, AMA     Discharge diagnosis: Term Pregnancy Delivered                                                                                                Post partum procedures:None  Augmentation: Pitocin  Complications: None  Hospital course:  Onset of Labor With Vaginal Delivery     40 y.o. yo G1P1001 at [redacted]w[redacted]d was admitted in Latent Labor on 10/23/2015. Patient had an uncomplicated labor course as follows:  Membrane Rupture Time/Date: 6:44 PM ,10/23/2015   Intrapartum Procedures: Episiotomy: None [1]                                         Lacerations:  1st degree [2];Labial [10]  Patient had a delivery of a Viable infant. 10/23/2015  Information for the patient's newborn:  Nyirah, Giegerich [id number]  Delivery Method: [place] had an uncomplicated postpartum course.  She is ambulating, tolerating a regular diet, passing flatus, and urinating well. Patient is discharged home in stable condition on 11/12/15.    Physical exam Vitals:   10/24/15 0405 10/24/15 1130 10/24/15 1723 10/25/15 0700  BP: 103/66 (!) 106/58 (!) 90/56 (!) 98/58  Pulse: 71 78 78 80  Resp: 18 18 18 18   Temp: 97.7 F (36.5 C) 98.2 F (36.8 C) 98.6 F (37 C) 97.9 F (36.6 C)  TempSrc: Oral Oral Oral Oral  SpO2: 99%     Weight:      Height:       General: alert, cooperative and no distress Lochia: appropriate Uterine Fundus: firm Incision: N/A DVT Evaluation: No evidence of DVT seen on physical exam. Labs: Lab Results  Component Value Date   WBC 13.3 (H)  10/24/2015   HGB 13.1 10/24/2015   HCT 36.5 10/24/2015   MCV 89.5 10/24/2015   PLT 189 10/24/2015   No flowsheet data found.  Discharge instruction: per After Visit Summary and "Baby and Me Booklet".  After visit meds:    Medication List    STOP taking these medications   NIFEdipine 10 MG capsule Commonly known as:  PROCARDIA     TAKE these medications   ibuprofen 600 MG tablet Commonly known as:  ADVIL,MOTRIN Take 1 tablet (600 mg total) by mouth every 6 (six) hours.   prenatal vitamin w/FE, FA 27-1 MG Tabs tablet Take 1 tablet by mouth every evening.       Diet: routine diet  Activity: Advance as tolerated. Pelvic rest for 6 weeks.   Outpatient follow up:6 weeks Follow up Appt:No future appointments. Follow up Visit:No Follow-up on file.  Postpartum contraception: Condoms  Newborn Data: Live born female  Birth Weight: 7 lb 0.9 oz (3200 g) APGAR: 8, 9  Baby Feeding: Breast Disposition:home with mother   10/25/2015 Thurnell Lose, MD

## 2015-10-25 NOTE — Lactation Note (Signed)
This note was copied from a baby's chart. Lactation Consultation Note: Mom has baby latched to breast when I went into room. Reports baby has been feeding a lot through the night. Reassurance given. Reports nipples are getting better. Dr Ronalee Red in to see baby. Reviewed engorgement prevention and treatment. Mom has Medela pump for home. Mom easily latched baby to other breast and still nursing as I left room. No questions at present. Reviewed our phone number, OP appointments and BFSG as resources for support after DC. To call prn  Patient Name: Kelly Suarez [id number] Date: 10/25/2015 Reason for consult: Follow-up assessment   Maternal Data Formula Feeding for Exclusion: No Has patient been taught Hand Expression?: Yes Does the patient have breastfeeding experience prior to this delivery?: No  Feeding Feeding Type: Breast Fed Length of feed: 10 min  LATCH Score/Interventions Latch: Grasps breast easily, tongue down, lips flanged, rhythmical sucking.  Audible Swallowing: A few with stimulation  Type of Nipple: Everted at rest and after stimulation  Comfort (Breast/Nipple): Filling, red/small blisters or bruises, mild/mod discomfort  Problem noted: Mild/Moderate discomfort Interventions  (Cracked/bleeding/bruising/blister): Expressed breast milk to nipple (coconut oil)  Hold (Positioning): No assistance needed to correctly position infant at breast. Intervention(s): Breastfeeding basics reviewed  LATCH Score: 8  Lactation Tools Discussed/Used WIC Program: No   Consult Status Consult Status: Complete    Truddie Crumble 10/25/2015, 8:18 AM

## 2015-10-29 ENCOUNTER — Inpatient Hospital Stay (HOSPITAL_COMMUNITY): Admission: RE | Admit: 2015-10-29 | Payer: BLUE CROSS/BLUE SHIELD | Source: Ambulatory Visit

## 2018-03-16 ENCOUNTER — Other Ambulatory Visit: Payer: Self-pay | Admitting: Obstetrics and Gynecology

## 2018-03-16 ENCOUNTER — Other Ambulatory Visit (HOSPITAL_COMMUNITY)
Admission: RE | Admit: 2018-03-16 | Discharge: 2018-03-16 | Disposition: A | Discharge status: Home / Self Care | Payer: BLUE CROSS/BLUE SHIELD | Source: Ambulatory Visit | Attending: Obstetrics and Gynecology | Admitting: Obstetrics and Gynecology

## 2018-03-16 DIAGNOSIS — Z01419 Encounter for gynecological examination (general) (routine) without abnormal findings: Secondary | ICD-10-CM | POA: Insufficient documentation

## 2018-03-18 LAB — CYTOLOGY - PAP
DIAGNOSIS: UNDETERMINED — AB
HPV: NOT DETECTED

## 2019-05-09 DIAGNOSIS — Z01419 Encounter for gynecological examination (general) (routine) without abnormal findings: Secondary | ICD-10-CM | POA: Diagnosis not present

## 2019-05-09 DIAGNOSIS — Z13220 Encounter for screening for lipoid disorders: Secondary | ICD-10-CM | POA: Diagnosis not present

## 2019-05-09 DIAGNOSIS — Z833 Family history of diabetes mellitus: Secondary | ICD-10-CM | POA: Diagnosis not present

## 2019-05-09 DIAGNOSIS — Z1329 Encounter for screening for other suspected endocrine disorder: Secondary | ICD-10-CM | POA: Diagnosis not present

## 2019-05-24 DIAGNOSIS — D259 Leiomyoma of uterus, unspecified: Secondary | ICD-10-CM | POA: Diagnosis not present

## 2020-06-12 DIAGNOSIS — D259 Leiomyoma of uterus, unspecified: Secondary | ICD-10-CM | POA: Diagnosis not present

## 2020-06-12 DIAGNOSIS — Z01419 Encounter for gynecological examination (general) (routine) without abnormal findings: Secondary | ICD-10-CM | POA: Diagnosis not present

## 2020-12-04 ENCOUNTER — Other Ambulatory Visit: Payer: Self-pay | Admitting: Obstetrics and Gynecology

## 2020-12-04 ENCOUNTER — Ambulatory Visit
Admission: RE | Admit: 2020-12-04 | Discharge: 2020-12-04 | Disposition: A | Discharge status: Home / Self Care | Payer: BC Managed Care – PPO | Source: Ambulatory Visit | Attending: Obstetrics and Gynecology | Admitting: Obstetrics and Gynecology

## 2020-12-04 DIAGNOSIS — Z1231 Encounter for screening mammogram for malignant neoplasm of breast: Secondary | ICD-10-CM

## 2020-12-10 ENCOUNTER — Other Ambulatory Visit: Payer: Self-pay | Admitting: Obstetrics and Gynecology

## 2020-12-10 DIAGNOSIS — R928 Other abnormal and inconclusive findings on diagnostic imaging of breast: Secondary | ICD-10-CM

## 2020-12-31 ENCOUNTER — Other Ambulatory Visit: Payer: BC Managed Care – PPO

## 2021-01-11 ENCOUNTER — Other Ambulatory Visit: Payer: BC Managed Care – PPO

## 2021-01-24 ENCOUNTER — Ambulatory Visit: Payer: BC Managed Care – PPO

## 2021-01-24 ENCOUNTER — Ambulatory Visit
Admission: RE | Admit: 2021-01-24 | Discharge: 2021-01-24 | Disposition: A | Discharge status: Home / Self Care | Payer: BC Managed Care – PPO | Source: Ambulatory Visit | Attending: Obstetrics and Gynecology | Admitting: Obstetrics and Gynecology

## 2021-01-24 ENCOUNTER — Other Ambulatory Visit: Payer: Self-pay | Admitting: Obstetrics and Gynecology

## 2021-01-24 DIAGNOSIS — N6489 Other specified disorders of breast: Secondary | ICD-10-CM | POA: Diagnosis not present

## 2021-01-24 DIAGNOSIS — R922 Inconclusive mammogram: Secondary | ICD-10-CM | POA: Diagnosis not present

## 2021-01-24 DIAGNOSIS — R928 Other abnormal and inconclusive findings on diagnostic imaging of breast: Secondary | ICD-10-CM

## 2021-01-29 ENCOUNTER — Other Ambulatory Visit: Payer: Self-pay | Admitting: Obstetrics and Gynecology

## 2021-01-30 ENCOUNTER — Other Ambulatory Visit: Payer: BC Managed Care – PPO

## 2021-02-01 ENCOUNTER — Other Ambulatory Visit: Payer: Self-pay | Admitting: Obstetrics and Gynecology

## 2021-02-01 DIAGNOSIS — R928 Other abnormal and inconclusive findings on diagnostic imaging of breast: Secondary | ICD-10-CM

## 2021-03-21 DIAGNOSIS — R928 Other abnormal and inconclusive findings on diagnostic imaging of breast: Secondary | ICD-10-CM | POA: Diagnosis not present

## 2021-06-07 DIAGNOSIS — L729 Follicular cyst of the skin and subcutaneous tissue, unspecified: Secondary | ICD-10-CM | POA: Diagnosis not present

## 2021-07-04 DIAGNOSIS — R928 Other abnormal and inconclusive findings on diagnostic imaging of breast: Secondary | ICD-10-CM | POA: Diagnosis not present

## 2021-07-12 DIAGNOSIS — N6012 Diffuse cystic mastopathy of left breast: Secondary | ICD-10-CM | POA: Diagnosis not present

## 2021-10-10 ENCOUNTER — Ambulatory Visit: Payer: BC Managed Care – PPO | Admitting: Family Medicine

## 2022-03-12 IMAGING — MG DIGITAL DIAGNOSTIC BILAT W/ TOMO W/ CAD
6 of 12 series · 6 of 36 positions shown · non-contrast
Comparison: Previous exam(s).

CLINICAL DATA: The patient was called back for a right breast
asymmetry and left breast masses.

EXAM:
DIGITAL DIAGNOSTIC BILATERAL MAMMOGRAM WITH TOMOSYNTHESIS AND CAD;
ULTRASOUND LEFT BREAST LIMITED
TECHNIQUE: Bilateral digital diagnostic mammography and breast tomosynthesis
was performed. The images were evaluated with computer-aided
detection.; Targeted ultrasound examination of the left breast was
performed.

[R MLO synth-2D (1 of 3)]
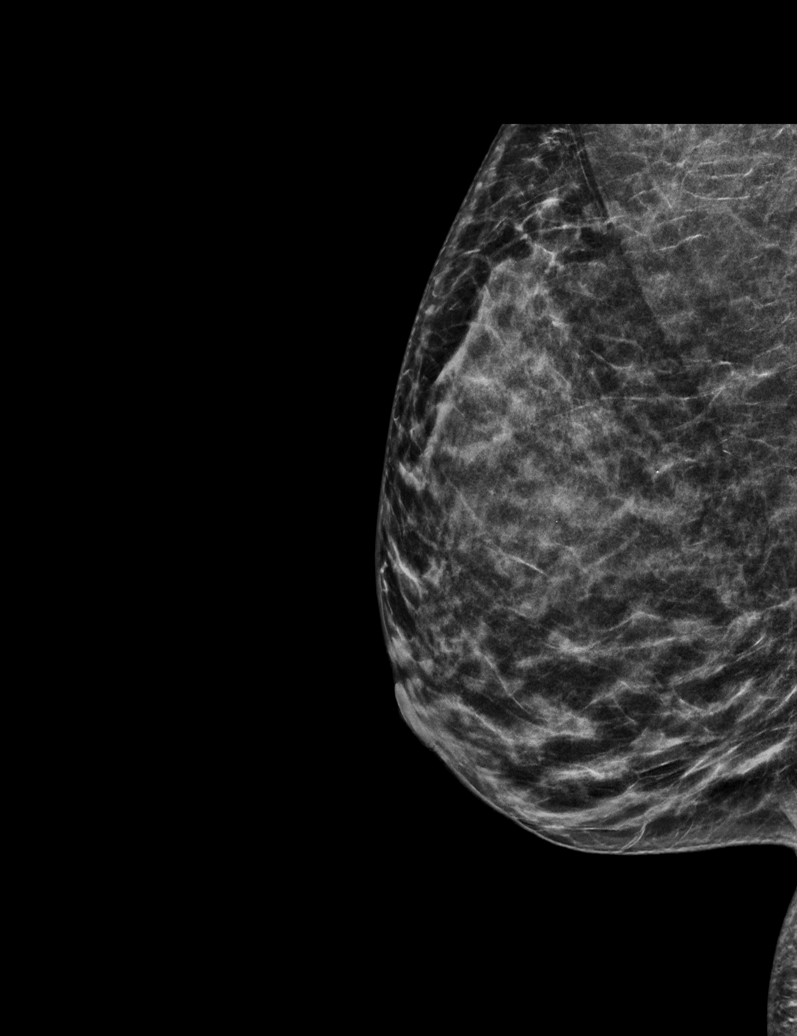

[R MLO synth-2D (2 of 3)]
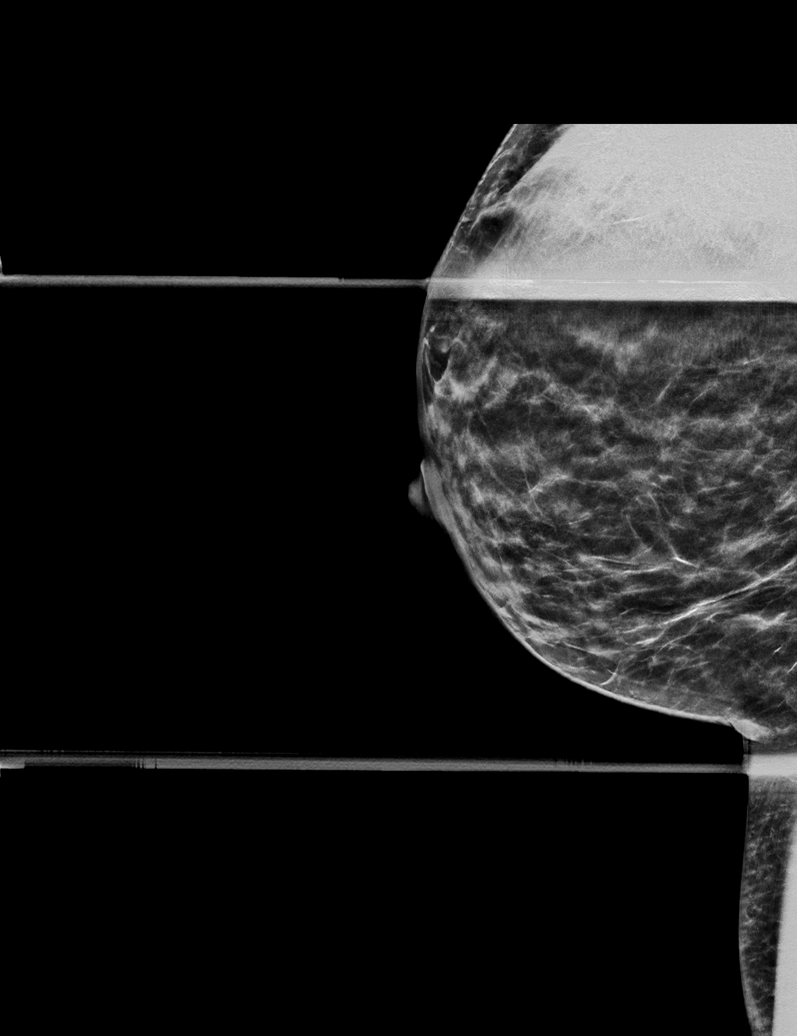

[L ML synth-2D]
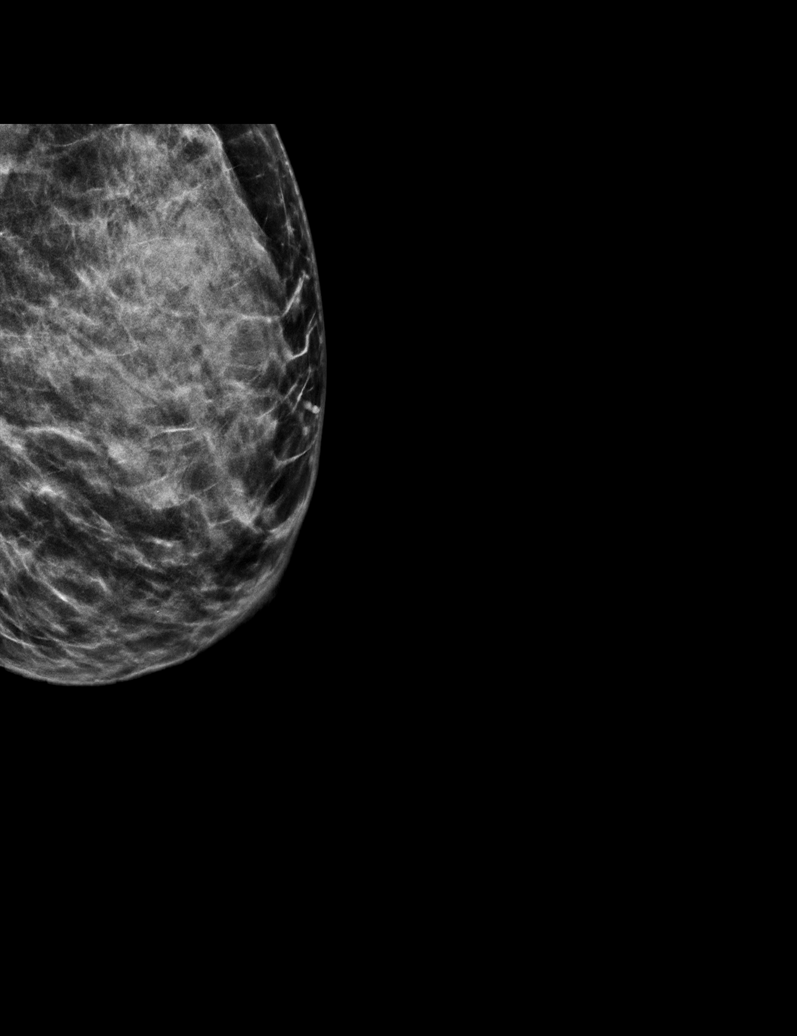

[R MLO synth-2D (3 of 3)]
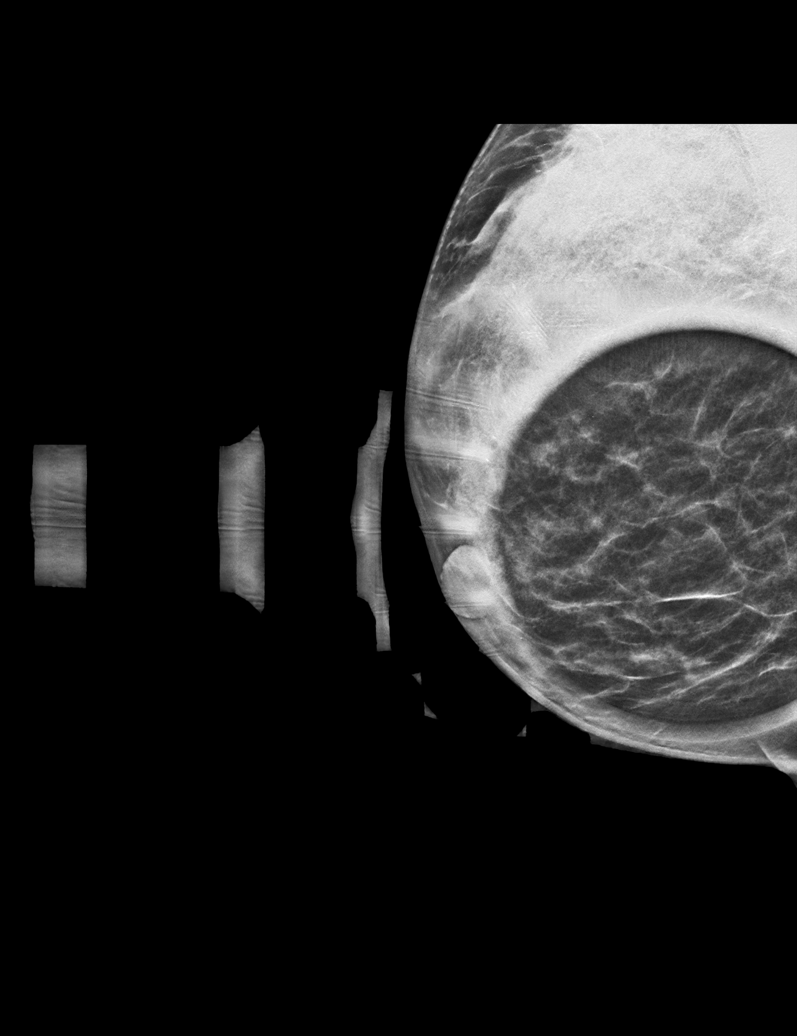

[L CC synth-2D]
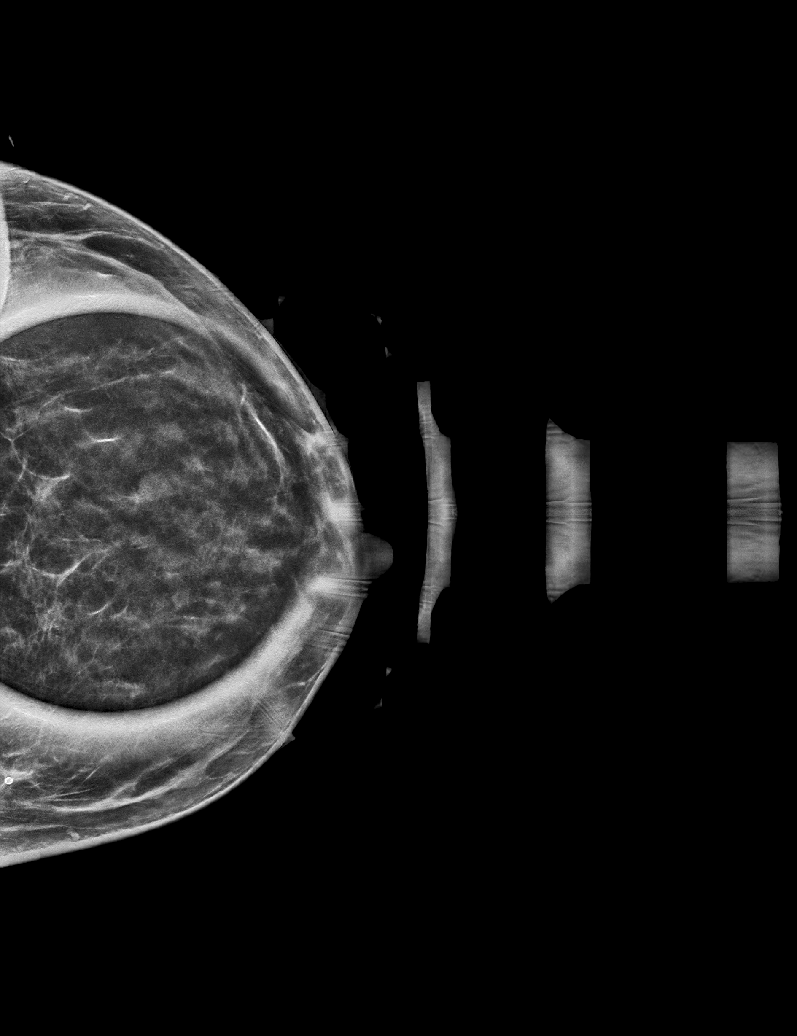

[R ML synth-2D]
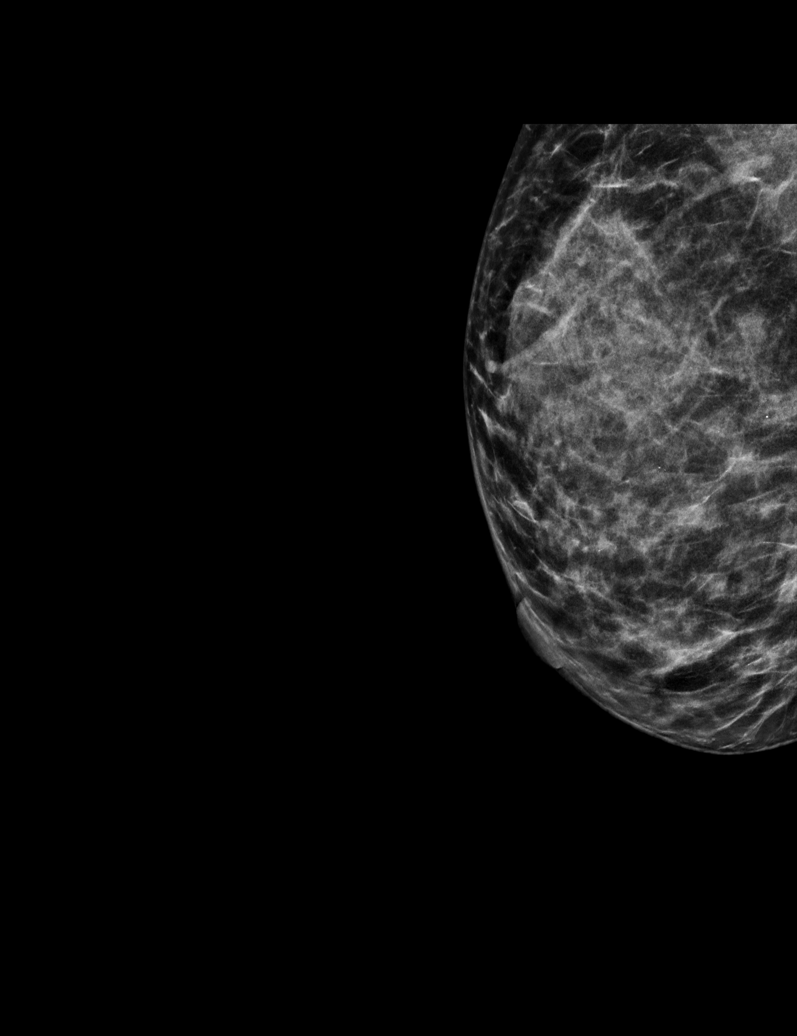

[6 of 36 positions shown; findings below may reference images not displayed]

ACR Breast Density Category c: The breast tissue is heterogeneously
dense, which may obscure small masses.
FINDINGS: The right breast asymmetry resolves on today's imaging. There are
multiple masses in the inferior left breast.

On physical exam, no suspicious lumps are identified.

Targeted ultrasound is performed, showing fibrocystic changes in the
inferior left breast. Most of the cysts are simple or mildly
complicated. There is a complex cystic mass at 3 o'clock, 4 cm from
the nipple. This is likely a cyst with tumefactive debris. A cyst
with a nodular component is not excluded. This mass measures 9 x 5 x
8 mm.
IMPRESSION: Complex cystic mass at 3 o'clock, 4 cm from the nipple in the left
breast measuring 9 x 5 x 8 mm.

RECOMMENDATION:
Recommend attempted aspiration of the left breast complex cystic
mass at 3 o'clock. If the mass does not aspirate, recommend biopsy.

I have discussed the findings and recommendations with the patient.
If applicable, a reminder letter will be sent to the patient
regarding the next appointment.

BI-RADS CATEGORY  4: Suspicious.

## 2022-10-01 LAB — HEPATIC FUNCTION PANEL
ALT: 8 U/L (ref 7–35)
AST: 13 (ref 13–35)
Alkaline Phosphatase: 57 (ref 25–125)

## 2022-10-01 LAB — BASIC METABOLIC PANEL
BUN: 3 — AB (ref 4–21)
Chloride: 102 (ref 99–108)
Creatinine: 1 (ref 0.5–1.1)
Glucose: 90
Potassium: 4.4 meq/L (ref 3.5–5.1)
Sodium: 140 (ref 137–147)

## 2022-10-01 LAB — CBC AND DIFFERENTIAL
HCT: 44 (ref 36–46)
Hemoglobin: 14 (ref 12.0–16.0)
WBC: 4.9

## 2022-10-01 LAB — LIPID PANEL
Cholesterol: 183 (ref 0–200)
HDL: 66 (ref 35–70)
LDL Cholesterol: 106
LDl/HDL Ratio: 1.6
Triglycerides: 59 (ref 40–160)

## 2022-10-01 LAB — TSH: TSH: 1.06 (ref 0.41–5.90)

## 2022-10-01 LAB — COMPREHENSIVE METABOLIC PANEL: Calcium: 9.6 (ref 8.7–10.7)

## 2022-10-01 LAB — CBC: RBC: 4.58 (ref 3.87–5.11)

## 2023-01-15 ENCOUNTER — Encounter: Payer: Self-pay | Admitting: Nurse Practitioner

## 2023-01-15 ENCOUNTER — Ambulatory Visit (INDEPENDENT_AMBULATORY_CARE_PROVIDER_SITE_OTHER): Payer: No Typology Code available for payment source | Admitting: Nurse Practitioner

## 2023-01-15 VITALS — BP 126/68 | HR 105 | Temp 97.8°F | Ht 62.0 in | Wt 132.0 lb

## 2023-01-15 DIAGNOSIS — R000 Tachycardia, unspecified: Secondary | ICD-10-CM | POA: Diagnosis not present

## 2023-01-15 DIAGNOSIS — Z1159 Encounter for screening for other viral diseases: Secondary | ICD-10-CM

## 2023-01-15 DIAGNOSIS — Z2821 Immunization not carried out because of patient refusal: Secondary | ICD-10-CM

## 2023-01-15 DIAGNOSIS — R140 Abdominal distension (gaseous): Secondary | ICD-10-CM | POA: Diagnosis not present

## 2023-01-15 DIAGNOSIS — N898 Other specified noninflammatory disorders of vagina: Secondary | ICD-10-CM | POA: Diagnosis not present

## 2023-01-15 DIAGNOSIS — Z7689 Persons encountering health services in other specified circumstances: Secondary | ICD-10-CM

## 2023-01-15 DIAGNOSIS — Z8249 Family history of ischemic heart disease and other diseases of the circulatory system: Secondary | ICD-10-CM | POA: Diagnosis not present

## 2023-01-15 DIAGNOSIS — Z1211 Encounter for screening for malignant neoplasm of colon: Secondary | ICD-10-CM | POA: Diagnosis not present

## 2023-01-15 MED ORDER — FLUCONAZOLE 100 MG PO TABS: 100.0000 mg | ORAL_TABLET | Freq: Every day | ORAL | 0 refills | Status: DC

## 2023-01-15 NOTE — Progress Notes (Signed)
 LILLETTE Kristeen JINNY Gladis, CMA,acting as a neurosurgeon for Gaines Ada, FNP.,have documented all relevant documentation on the behalf of Gaines Ada, FNP,as directed by  Gaines Ada, FNP while in the presence of Gaines Ada, FNP.  Subjective:  Patient ID: Kelly Suarez , female    DOB: 1975-07-27 , 48 y.o.   MRN: [id number]  Chief Complaint  Patient presents with   Establish Care    HPI  Patient presents today to establish care. She was referred by a friend. 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. Married. Patient denies any chest pain, SOB, or headaches. Patient reports she has a lot of bloating, patient reports she is always bloated. She is sometimes having good bowel movements. She notices her bloating more around her menstrual cycle. She is drinking more water. Her mother lives with her so they try to eat more vegetables. Patient reports she also has a vaginal itch she can't get rid of, she reports having it for several weeks- she did try OTC medications. Patient reports she hasn't had a PCP in a while. She is seeing a GYN - [facility] it was Dr. Felisa and is now seeing Dr. Rosalva.   Patient does have a 43 year old daughter - healthy.   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   Maternal grandmother - CHF Father - prediabetes, HTN, questionable elevated cholesterol Siblings - 3 older brothers and 2 sisters.  Vitamin d - 10,000 units daily Balance - balance hormones Multivitamin - garden life Adrenal support medications Will start her probiotics later this month LMP - December 24, 2022 -  continues to be regular.       Past Medical History:  Diagnosis Date   Breast mass    Fibroid    X 5   Yeast infection      Family History  Problem Relation Age of Onset   Heart disease Mother        mitral valve prolapse   Asthma Mother    Cancer Maternal Grandfather        bone     Current Outpatient Medications:     fluconazole  (DIFLUCAN ) 100 MG tablet, Take 1 tablet (100 mg total) by mouth daily. Take 1 tablet by mouth now repeat in 5 days, Disp: 2 tablet, Rfl: 0   No Known Allergies   Review of Systems  Constitutional: Negative.   Respiratory: Negative.    Cardiovascular: Negative.   Gastrointestinal: Negative.   Genitourinary:  Negative for vaginal discharge.       Vaginal itching  Musculoskeletal: Negative.   Neurological: Negative.   Psychiatric/Behavioral: Negative.       Today's Vitals   01/15/23 1415  BP: 126/68  Pulse: (!) 105  Temp: 97.8 F (36.6 C)  TempSrc: Oral  Weight: 132 lb (59.9 kg)  Height: 5' 2 (1.575 m)  PainSc: 0-No pain   Body mass index is 24.14 kg/m.  Wt Readings from Last 3 Encounters:  01/15/23 132 lb (59.9 kg)  10/23/15 152 lb (68.9 kg)  09/21/15 150 lb (68 kg)     Objective:  Physical Exam Vitals reviewed.  Constitutional:      Appearance: Normal appearance. She is well-developed and normal weight.  HENT:     Head: Normocephalic and atraumatic.  Cardiovascular:     Rate and Rhythm: Normal rate and regular rhythm.     Pulses: Normal pulses.  Heart sounds: Normal heart sounds. No murmur heard. Pulmonary:     Effort: Pulmonary effort is normal. No respiratory distress.     Breath sounds: Normal breath sounds. No wheezing.  Skin:    General: Skin is warm and dry.     Capillary Refill: Capillary refill takes less than 2 seconds.  Neurological:     General: No focal deficit present.     Mental Status: She is alert and oriented to person, place, and time.     Cranial Nerves: No cranial nerve deficit.  Psychiatric:        Mood and Affect: Mood normal.        Behavior: Behavior normal.        Thought Content: Thought content normal.        Judgment: Judgment normal.         Assessment And Plan:  Establishing care with new doctor, encounter for Assessment & Plan: Behavior modifications discussed and diet history reviewed.   Pt will  continue to exercise regularly and modify diet with low GI, plant based foods and decrease intake of processed foods.  Recommend intake of daily multivitamin, Vitamin D, and calcium .  Recommend mammogram and colonoscopy for preventive screenings, as well as recommend immunizations that include influenza, TDAP, and Shingles     Abdominal bloating Assessment & Plan: Encouraged to take a probiotic daily and avoid high gas foods   Vaginal itching Assessment & Plan: Will check vaginal swab for bacterial vaginosis or yeast  Orders: -     NuSwab Vaginitis Plus (VG+) -     Fluconazole ; Take 1 tablet (100 mg total) by mouth daily. Take 1 tablet by mouth now repeat in 5 days  Dispense: 2 tablet; Refill: 0  Tachycardia Assessment & Plan: HR 105, EKG done NSR HR 81, this elevation may be related to the fact this is her first visit to the office.  However due to her family history of cardiac disease I will refer her to the Cardiologist.  Orders: -     EKG 12-Lead -     BMP8+eGFR  Screening for colon cancer Assessment & Plan: According to USPTF Colorectal cancer Screening guidelines. Cologuard is recommended every 3 years, starting at age 19 years. Order for cologuard sent    Orders: -     Cologuard; Future  COVID-19 vaccination declined Assessment & Plan: Declines covid 19 vaccine. Discussed risk of covid 8 and if she changes her mind about the vaccine to call the office. Education has been provided regarding the importance of this vaccine but patient still declined. Advised may receive this vaccine at local pharmacy or Health Dept.or vaccine clinic. Aware to provide a copy of the vaccination record if obtained from local pharmacy or Health Dept.  Encouraged to take multivitamin, vitamin d, vitamin c and zinc to increase immune system. Aware can call office if would like to have vaccine here at office. Verbalized acceptance and understanding.    Influenza vaccination  declined Assessment & Plan: Patient declined influenza vaccination at this time. Patient is aware that influenza vaccine prevents illness in 70% of healthy people, and reduces hospitalizations to 30-70% in elderly. This vaccine is recommended annually. Education has been provided regarding the importance of this vaccine but patient still declined. Advised may receive this vaccine at local pharmacy or Health Dept.or vaccine clinic. Aware to provide a copy of the vaccination record if obtained from local pharmacy or Health Dept.  Pt is willing to accept risk associated with  refusing vaccination.    Encounter for hepatitis C screening test for low risk patient Assessment & Plan: Will check Hepatitis C screening due to recent recommendations to screen all adults 18 years and older   Orders: -     Hepatitis C antibody  Family history of CHF (congestive heart failure) -     EKG 12-Lead -     Ambulatory referral to Cardiology    Return in about 4 months (around 05/15/2023) for PHY WHEN ABLE.SABRA  Patient was given opportunity to ask questions. Patient verbalized understanding of the plan and was able to repeat key elements of the plan. All questions were answered to their satisfaction.    LILLETTE Gaines Ada, FNP, have reviewed all documentation for this visit. The documentation on 01/21/23 for the exam, diagnosis, procedures, and orders are all accurate and complete.   IF YOU HAVE BEEN REFERRED TO A SPECIALIST, IT MAY TAKE 1-2 WEEKS TO SCHEDULE/PROCESS THE REFERRAL. IF YOU HAVE NOT HEARD FROM US /SPECIALIST IN TWO WEEKS, PLEASE GIVE US  A CALL AT [phone].

## 2023-01-16 LAB — BMP8+EGFR
BUN/Creatinine Ratio: 13 (ref 9–23)
BUN: 14 mg/dL (ref 6–24)
CO2: 25 mmol/L (ref 20–29)
Calcium: 10.1 mg/dL (ref 8.7–10.2)
Chloride: 101 mmol/L (ref 96–106)
Creatinine, Ser: 1.07 mg/dL — ABNORMAL HIGH (ref 0.57–1.00)
Glucose: 87 mg/dL (ref 70–99)
Potassium: 4.3 mmol/L (ref 3.5–5.2)
Sodium: 139 mmol/L (ref 134–144)
eGFR: 64 mL/min/{1.73_m2} (ref >59)

## 2023-01-16 LAB — HEPATITIS C ANTIBODY: Hep C Virus Ab: NONREACTIVE

## 2023-01-17 LAB — NUSWAB VAGINITIS PLUS (VG+)
Candida albicans, NAA: NEGATIVE
Candida glabrata, NAA: NEGATIVE
Chlamydia trachomatis, NAA: NEGATIVE
Neisseria gonorrhoeae, NAA: NEGATIVE
Trich vag by NAA: NEGATIVE

## 2023-01-20 DIAGNOSIS — R000 Tachycardia, unspecified: Secondary | ICD-10-CM | POA: Insufficient documentation

## 2023-01-20 DIAGNOSIS — Z1211 Encounter for screening for malignant neoplasm of colon: Secondary | ICD-10-CM | POA: Insufficient documentation

## 2023-01-20 DIAGNOSIS — Z2821 Immunization not carried out because of patient refusal: Secondary | ICD-10-CM | POA: Insufficient documentation

## 2023-01-20 DIAGNOSIS — Z7689 Persons encountering health services in other specified circumstances: Secondary | ICD-10-CM | POA: Insufficient documentation

## 2023-01-20 DIAGNOSIS — Z1159 Encounter for screening for other viral diseases: Secondary | ICD-10-CM | POA: Insufficient documentation

## 2023-01-20 DIAGNOSIS — N898 Other specified noninflammatory disorders of vagina: Secondary | ICD-10-CM | POA: Insufficient documentation

## 2023-01-20 DIAGNOSIS — R140 Abdominal distension (gaseous): Secondary | ICD-10-CM | POA: Insufficient documentation

## 2023-01-21 NOTE — Assessment & Plan Note (Addendum)
 According to USPTF Colorectal cancer Screening guidelines. Cologuard is recommended every 3 years, starting at age 49 years. Order for cologuard sent

## 2023-01-21 NOTE — Assessment & Plan Note (Signed)
 Encouraged to take a probiotic daily and avoid high gas foods

## 2023-01-21 NOTE — Assessment & Plan Note (Signed)
 Will check Hepatitis C screening due to recent recommendations to screen all adults 18 years and older

## 2023-01-21 NOTE — Assessment & Plan Note (Signed)
 Will check vaginal swab for bacterial vaginosis or yeast

## 2023-01-21 NOTE — Assessment & Plan Note (Signed)
 HR 105, EKG done NSR HR 81, this elevation may be related to the fact this is her first visit to the office.  However due to her family history of cardiac disease I will refer her to the Cardiologist.

## 2023-01-21 NOTE — Assessment & Plan Note (Signed)

## 2023-01-21 NOTE — Assessment & Plan Note (Signed)

## 2023-01-21 NOTE — Assessment & Plan Note (Signed)

## 2023-01-30 LAB — HM MAMMOGRAPHY

## 2023-03-04 ENCOUNTER — Other Ambulatory Visit: Payer: Self-pay | Admitting: Nurse Practitioner

## 2023-03-04 ENCOUNTER — Telehealth: Payer: Self-pay | Admitting: Nurse Practitioner

## 2023-03-04 DIAGNOSIS — Z1211 Encounter for screening for malignant neoplasm of colon: Secondary | ICD-10-CM

## 2023-03-04 NOTE — Telephone Encounter (Unsigned)
Copied from CRM [id number]. Topic: Clinical - Request for Lab/Test Order >> Mar 04, 2023  4:11 PM Gery Pray wrote: Reason for CRM: Patient called in regarding a test that was supposed to be sent over to the lab per her last visit with her provider. However, when calling the lab they stated that it was never received and she was not in the system. Test is a Cologuard. Last visit was 01/15/23. Contact patient at [phone].

## 2023-03-06 NOTE — Telephone Encounter (Signed)
Pt called back to check on re order that was requested. Pt advised cologuard test was re ordered on 03/04/2023. Advised patient a mychart message was sent to her in regards to this. Pt verbalized understanding and expressed her gratitude.

## 2023-03-23 ENCOUNTER — Other Ambulatory Visit: Payer: Self-pay | Admitting: Nurse Practitioner

## 2023-03-23 DIAGNOSIS — Z1211 Encounter for screening for malignant neoplasm of colon: Secondary | ICD-10-CM

## 2023-03-23 NOTE — Telephone Encounter (Signed)
 Patient states that she still has not received her Cologuard Testing Kit. Patient states that she called Cologuard this morning and they show no record of her. She was given Reference #[id number] for the office to use if they call. Please advise.

## 2023-04-09 ENCOUNTER — Ambulatory Visit (INDEPENDENT_AMBULATORY_CARE_PROVIDER_SITE_OTHER): Admitting: Cardiovascular Disease

## 2023-04-09 ENCOUNTER — Encounter (HOSPITAL_BASED_OUTPATIENT_CLINIC_OR_DEPARTMENT_OTHER): Payer: Self-pay | Admitting: Cardiovascular Disease

## 2023-04-09 VITALS — BP 114/80 | HR 84 | Ht 62.0 in | Wt 132.6 lb

## 2023-04-09 DIAGNOSIS — Z136 Encounter for screening for cardiovascular disorders: Secondary | ICD-10-CM

## 2023-04-09 DIAGNOSIS — R000 Tachycardia, unspecified: Secondary | ICD-10-CM

## 2023-04-09 DIAGNOSIS — Z8249 Family history of ischemic heart disease and other diseases of the circulatory system: Secondary | ICD-10-CM | POA: Diagnosis not present

## 2023-04-09 HISTORY — DX: Family history of ischemic heart disease and other diseases of the circulatory system: Z82.49

## 2023-04-09 LAB — COLOGUARD: COLOGUARD: NEGATIVE

## 2023-04-09 NOTE — Patient Instructions (Signed)
 Medication Instructions:  Your physician recommends that you continue on your current medications as directed. Please refer to the Current Medication list given to you today.   Testing/Procedures: Your physician has recommend you to have a coronary calcium score. This is a self pay test that will cost $99  Your physician has requested that you have an echocardiogram. Echocardiography is a painless test that uses sound waves to create images of your heart. It provides your doctor with information about the size and shape of your heart and how well your heart's chambers and valves are working. This procedure takes approximately one hour. There are no restrictions for this procedure. Please do NOT wear cologne, perfume, aftershave, or lotions (deodorant is allowed). Please arrive 15 minutes prior to your appointment time.  Please note: We ask at that you not bring children with you during ultrasound (echo/ vascular) testing. Due to room size and safety concerns, children are not allowed in the ultrasound rooms during exams. Our front office staff cannot provide observation of children in our lobby area while testing is being conducted. An adult accompanying a patient to their appointment will only be allowed in the ultrasound room at the discretion of the ultrasound technician under special circumstances. We apologize for any inconvenience.   Follow-Up: At [facility], you and your health needs are our priority.  As part of our continuing mission to provide you with exceptional heart care, we have created designated Provider Care Teams.  These Care Teams include your primary Cardiologist (physician) and Advanced Practice Providers (APPs -  Physician Assistants and Nurse Practitioners) who all work together to provide you with the care you need, when you need it.  We recommend signing up for the patient portal called "MyChart".  Sign up information is provided on this After Visit Summary.   MyChart is used to connect with patients for Virtual Visits (Telemedicine).  Patients are able to view lab/test results, encounter notes, upcoming appointments, etc.  Non-urgent messages can be sent to your provider as well.   To learn more about what you can do with MyChart, go to [email or web address].    Your next appointment:   Call us if you need [place]

## 2023-04-09 NOTE — Progress Notes (Signed)
 Cardiology Office Note:  .   Date:  04/09/2023  ID:  Ulice Bold, DOB 06-09-1975, MRN [id number] PCP: Arnette Felts, FNP  [facility] Providers Cardiologist:  None    History of Present Illness: .   Kelly Suarez is a 48 y.o. female with no significant past medical history here for evaluation of tachycardia.  She established with a new primary care provider, Arnette Felts, FNP 01/2023.  At the time she was noted to be tachycardic to 105.  On EKG she was in sinus rhythm at 81 bpm.  Given that she has a family history of cardiac disease she was referred to cardiology.  Discussed the use of AI scribe software for clinical note transcription with the patient, who gave verbal consent to proceed.  History of Present Illness Kelly Suarez is concerned about her heart health due to a significant family history of heart disease. Her grandmother died of congestive heart failure in her sixties, and her mother has mitral valve prolapse and cardiomyopathy. Despite this family history, she currently has no symptoms of heart disease.  No chest pain, pressure, or shortness of breath during physical activities such as walking or climbing stairs. No swelling in her legs or feet and no shortness of breath when lying down. She denies palpitations or a racing heart.  Her lifestyle includes occasional alcohol consumption, primarily with meals, and she does not smoke. She wants to return to exercising, particularly weight training and cardio activities, as she was a former track runner. Her diet is balanced, with an emphasis on vegetables, although her [place] background influences her meals, which sometimes include rice.  She is not currently on any medications and does not monitor her blood pressure at home. Her cholesterol levels were discussed, with a total cholesterol of 183, HDL of 66, and LDL of 106.  ROS:  As per HPI  Studies Reviewed: Marland Kitchen   EKG Interpretation Date/Time:  Thursday April 09 2023 10:03:57  EDT Ventricular Rate:  84 PR Interval:  122 QRS Duration:  72 QT Interval:  364 QTC Calculation: 430 R Axis:   62  Text Interpretation: Normal sinus rhythm Normal ECG No previous ECGs available Confirmed by Chilton Si ([id number]) on 04/09/2023 10:14:31 AM    Risk Assessment/Calculations:             Physical Exam:   VS:  BP 114/80   Pulse 84   Ht 5\' 2"  (1.575 m)   Wt 132 lb 9.6 oz (60.1 kg)   SpO2 98%   BMI 24.25 kg/m  , BMI Body mass index is 24.25 kg/m. GENERAL:  Well appearing HEENT: Pupils equal round and reactive, fundi not visualized, oral mucosa unremarkable NECK:  No jugular venous distention, waveform within normal limits, carotid upstroke brisk and symmetric, no bruits, no thyromegaly LUNGS:  Clear to auscultation bilaterally HEART:  RRR.  PMI not displaced or sustained,S1 and S2 within normal limits, no S3, no S4, no clicks, no rubs, no murmurs ABD:  Flat, positive bowel sounds normal in frequency in pitch, no bruits, no rebound, no guarding, no midline pulsatile mass, no hepatomegaly, no splenomegaly EXT:  2 plus pulses throughout, no edema, no cyanosis no clubbing SKIN:  No rashes no nodules NEURO:  Cranial nerves II through XII grossly intact, motor grossly intact throughout PSYCH:  Cognitively intact, oriented to person place and time   ASSESSMENT AND PLAN: .    Assessment & Plan # Family history of heart disease Family history includes grandmother with congestive  heart failure and mother with mitral valve prolapse and cardiomyopathy. Asymptomatic with low 10-year risk of heart attack or stroke (1.2%). LDL cholesterol slightly elevated at 106 mg/dL, not requiring medication. Echocardiogram recommended due to family history. Coronary calcium score discussed as optional. - Order echocardiogram to screen for heart failure or cardiomyopathy. - She is considering having a coronary calcium score - Advise monitoring for symptoms such as dyspnea, chest pain, or  edema and return if these occur.  # Tachycardia:  Transiently noted in clinic.  She has no symptomatic palpitaitons.  HR improved by the time she had an EKG and she is not tachycardic today.  TSH, CMP and CBC are unremarkable.  Continue to monitor.   # Elevated LDL cholesterol LDL cholesterol slightly elevated at 106 mg/dL. Current risk of heart attack or stroke is low, so medication not indicated. Emphasized diet and exercise in managing cholesterol levels. Medication considered if risk increases to 7.5% or more. - Advise continuation of current diet and exercise regimen to manage cholesterol levels. - Increase exercise to at least 150 minutes weekly.  # Follow-up Follow-up based on echocardiogram results and symptom development. - Schedule echocardiogram and follow up with results. - Advise follow-up as needed based on echocardiogram results and symptom development.     Signed, Chilton Si, MD

## 2023-04-23 ENCOUNTER — Telehealth (HOSPITAL_BASED_OUTPATIENT_CLINIC_OR_DEPARTMENT_OTHER): Payer: Self-pay | Admitting: Cardiovascular Disease

## 2023-04-23 NOTE — Telephone Encounter (Signed)
 Patient canceled CT CARDIAC SCORING test and wants a call back to discuss next steps.

## 2023-04-23 NOTE — Telephone Encounter (Signed)
 She states she received a letter from insurance inquiring about need for echo. Asked patient if she will send [place] a picture of the letter to her mychart, she is willing. Advised once received we will send to the precert team to follow up and then get back with her on next steps.     Per last OV  Echocardiogram recommended due to family history. Coronary calcium score discussed as optional. - Order echocardiogram to screen for heart failure or cardiomyopathy. - She is considering having a coronary calcium score - Advise monitoring for symptoms such as dyspnea, chest pain, or edema and return if these occur.

## 2023-04-24 ENCOUNTER — Other Ambulatory Visit (HOSPITAL_BASED_OUTPATIENT_CLINIC_OR_DEPARTMENT_OTHER)

## 2023-04-29 ENCOUNTER — Telehealth (HOSPITAL_BASED_OUTPATIENT_CLINIC_OR_DEPARTMENT_OTHER): Payer: Self-pay | Admitting: Cardiovascular Disease

## 2023-04-29 NOTE — Telephone Encounter (Signed)
 I just wanted to make aware, that patient has rescheduled her calcium score and pushed out her echo due to the inusrance not covering. They had sent her a letter needing more information. She has advised that she will be adding the documentation thru mychart. Just a heads up.

## 2023-04-30 NOTE — Telephone Encounter (Signed)
 Noted, will look for updated documentation!

## 2023-05-05 ENCOUNTER — Other Ambulatory Visit (HOSPITAL_BASED_OUTPATIENT_CLINIC_OR_DEPARTMENT_OTHER)

## 2023-05-19 ENCOUNTER — Encounter: Payer: No Typology Code available for payment source | Admitting: Nurse Practitioner

## 2023-05-20 ENCOUNTER — Ambulatory Visit (HOSPITAL_COMMUNITY)
Admission: RE | Admit: 2023-05-20 | Discharge: 2023-05-20 | Disposition: A | Discharge status: Home / Self Care | Payer: Self-pay | Source: Ambulatory Visit | Attending: Cardiovascular Disease | Admitting: Cardiovascular Disease

## 2023-05-20 DIAGNOSIS — Z136 Encounter for screening for cardiovascular disorders: Secondary | ICD-10-CM | POA: Insufficient documentation

## 2023-05-23 ENCOUNTER — Encounter (HOSPITAL_BASED_OUTPATIENT_CLINIC_OR_DEPARTMENT_OTHER): Payer: Self-pay | Admitting: Cardiovascular Disease

## 2023-05-26 ENCOUNTER — Encounter: Payer: No Typology Code available for payment source | Admitting: Nurse Practitioner

## 2023-06-01 ENCOUNTER — Ambulatory Visit (HOSPITAL_BASED_OUTPATIENT_CLINIC_OR_DEPARTMENT_OTHER): Payer: Self-pay | Admitting: Cardiovascular Disease

## 2023-06-16 ENCOUNTER — Ambulatory Visit: Admitting: Nurse Practitioner

## 2023-06-16 ENCOUNTER — Encounter: Payer: Self-pay | Admitting: Nurse Practitioner

## 2023-06-16 ENCOUNTER — Other Ambulatory Visit (HOSPITAL_BASED_OUTPATIENT_CLINIC_OR_DEPARTMENT_OTHER)

## 2023-06-16 VITALS — BP 110/80 | HR 86 | Temp 98.1°F | Ht 62.0 in | Wt 135.0 lb

## 2023-06-16 DIAGNOSIS — N182 Chronic kidney disease, stage 2 (mild): Secondary | ICD-10-CM

## 2023-06-16 LAB — POCT URINALYSIS DIPSTICK
Bilirubin, UA: NEGATIVE
Blood, UA: NEGATIVE
Glucose, UA: NEGATIVE
Leukocytes, UA: NEGATIVE
Nitrite, UA: NEGATIVE
Protein, UA: POSITIVE — AB
Spec Grav, UA: >=1.03 — AB (ref 1.010–1.025)
Urobilinogen, UA: 0.2 U/dL
pH, UA: 5.5 (ref 5.0–8.0)

## 2023-06-16 NOTE — Progress Notes (Signed)
 I,Jameka J Llittleton, CMA,acting as a Neurosurgeon for [employer], FNP.,have documented all relevant documentation on the behalf of Susanna Epley, FNP,as directed by  Susanna Epley, FNP while in the presence of Susanna Epley, FNP.  Subjective:  Patient ID: Kelly Suarez , female    DOB: 08-26-1975 , 48 y.o.   MRN: [id number]  Chief Complaint  Patient presents with   Results    Patient presents today to recheck her kidney levels.    HPI  Patient concerned about creatinine increasing, last 1.03. No significant family history of kidney disease.    No urinary changes, normal color and frequency.  No issues with swelling in feet and legs.  No history of cardiac issues, HTN.  No recent history of UTI.  Takes tylenol  as needed for pain instead of motrin  or Advil .   Drinks water daily, at least 80 oz per day over the past 3 weeks.  At times when she wakes up and has to urinate she has flank pain on right side.  After urination the pain goes away.       Past Medical History:  Diagnosis Date   Breast mass    Family history of heart failure 04/09/2023   Fibroid    X 5   Yeast infection      Family History  Problem Relation Age of Onset   Heart disease Mother        mitral valve prolapse   Asthma Mother    Heart failure Maternal Grandmother    Cancer Maternal Grandfather        bone    No current outpatient medications on file.   No Known Allergies   Review of Systems  Constitutional:  Negative for chills, fatigue and fever.  HENT:  Negative for congestion.   Respiratory:  Negative for cough, chest tightness and shortness of breath.   Cardiovascular:  Negative for chest pain, palpitations and leg swelling.  Gastrointestinal:  Negative for constipation, diarrhea and nausea.  Endocrine: Negative for polydipsia, polyphagia and polyuria.  Genitourinary:  Positive for flank pain (left). Negative for decreased urine volume, difficulty urinating, dysuria and pelvic pain.  Neurological:   Negative for dizziness, weakness and light-headedness.     Today's Vitals   06/16/23 1204  BP: 110/80  Pulse: 86  Temp: 98.1 F (36.7 C)  TempSrc: Oral  Weight: 135 lb (61.2 kg)  Height: 5\' 2"  (1.575 m)  PainSc: 0-No pain   Body mass index is 24.69 kg/m.  Wt Readings from Last 3 Encounters:  06/16/23 135 lb (61.2 kg)  04/09/23 132 lb 9.6 oz (60.1 kg)  01/15/23 132 lb (59.9 kg)      Objective:  Physical Exam Constitutional:      Appearance: Normal appearance.  HENT:     Head: Normocephalic and atraumatic.     Mouth/Throat:     Mouth: Mucous membranes are moist.     Pharynx: Oropharynx is clear.  Cardiovascular:     Rate and Rhythm: Normal rate and regular rhythm.     Pulses: Normal pulses.     Heart sounds: Normal heart sounds.  Pulmonary:     Effort: Pulmonary effort is normal.     Breath sounds: Normal breath sounds.  Abdominal:     Tenderness: There is no right CVA tenderness or left CVA tenderness.  Musculoskeletal:        General: No swelling or tenderness.     Right lower leg: No edema.     Left lower leg:  No edema.  Skin:    Capillary Refill: Capillary refill takes less than 2 seconds.  Neurological:     General: No focal deficit present.     Mental Status: She is alert and oriented to person, place, and time. Mental status is at baseline.  Psychiatric:        Mood and Affect: Mood normal.        Behavior: Behavior normal.        Thought Content: Thought content normal.        Judgment: Judgment normal.         Assessment And Plan:  CKD (chronic kidney disease), stage II Assessment & Plan: I have discussed with her the stages of CKD. She is trying to drink more water. We will recheck her kidney functions. Will also check a renal ultrasound since she has been having flank pain.   Orders: -     Basic metabolic panel with GFR -     US  RENAL; Future -     POCT urinalysis dipstick -     Microalbumin / creatinine urine ratio    Return for phy 4  months.  Patient was given opportunity to ask questions. Patient verbalized understanding of the plan and was able to repeat key elements of the plan. All questions were answered to their satisfaction.   I have reviewed this encounter including the documentation in this note and/or discussed this patient with Mickael Alamo FNP Student. I am certifying that I agree with the content of this note as the primary care nurse practitioner.  Susanna Epley, DNP, FNP-BC  I, Susanna Epley, FNP, have reviewed all documentation for this visit. The documentation on 06/16/23 for the exam, diagnosis, procedures, and orders are all accurate and complete.   IF YOU HAVE BEEN REFERRED TO A SPECIALIST, IT MAY TAKE 1-2 WEEKS TO SCHEDULE/PROCESS THE REFERRAL. IF YOU HAVE NOT HEARD FROM US /SPECIALIST IN TWO WEEKS, PLEASE GIVE US  A CALL AT [phone].

## 2023-06-17 ENCOUNTER — Ambulatory Visit
Admission: RE | Admit: 2023-06-17 | Discharge: 2023-06-17 | Discharge status: Home / Self Care | Source: Ambulatory Visit | Attending: Nurse Practitioner | Admitting: Nurse Practitioner

## 2023-06-17 DIAGNOSIS — N182 Chronic kidney disease, stage 2 (mild): Secondary | ICD-10-CM

## 2023-06-17 LAB — MICROALBUMIN / CREATININE URINE RATIO
Creatinine, Urine: 392.4 mg/dL
Microalb/Creat Ratio: 21 mg/g{creat} (ref 0–29)
Microalbumin, Urine: 81.7 ug/mL

## 2023-06-17 LAB — BASIC METABOLIC PANEL WITH GFR
BUN/Creatinine Ratio: 11 (ref 9–23)
BUN: 10 mg/dL (ref 6–24)
CO2: 22 mmol/L (ref 20–29)
Calcium: 10 mg/dL (ref 8.7–10.2)
Chloride: 101 mmol/L (ref 96–106)
Creatinine, Ser: 0.93 mg/dL (ref 0.57–1.00)
Glucose: 86 mg/dL (ref 70–99)
Potassium: 4.3 mmol/L (ref 3.5–5.2)
Sodium: 139 mmol/L (ref 134–144)
eGFR: 76 mL/min/{1.73_m2} (ref >59)

## 2023-06-18 ENCOUNTER — Ambulatory Visit: Payer: Self-pay | Admitting: Nurse Practitioner

## 2023-06-21 ENCOUNTER — Encounter: Payer: Self-pay | Admitting: Nurse Practitioner

## 2023-06-21 DIAGNOSIS — N182 Chronic kidney disease, stage 2 (mild): Secondary | ICD-10-CM | POA: Insufficient documentation

## 2023-06-21 NOTE — Assessment & Plan Note (Signed)
 I have discussed with her the stages of CKD. She is trying to drink more water. We will recheck her kidney functions. Will also check a renal ultrasound since she has been having flank pain.

## 2023-10-21 ENCOUNTER — Encounter: Payer: Self-pay | Admitting: Nurse Practitioner

## 2023-10-21 ENCOUNTER — Ambulatory Visit: Admitting: Nurse Practitioner

## 2023-10-21 VITALS — BP 110/60 | HR 85 | Temp 98.4°F | Ht 62.0 in | Wt 137.4 lb

## 2023-10-21 DIAGNOSIS — R140 Abdominal distension (gaseous): Secondary | ICD-10-CM | POA: Diagnosis not present

## 2023-10-21 DIAGNOSIS — Z136 Encounter for screening for cardiovascular disorders: Secondary | ICD-10-CM

## 2023-10-21 DIAGNOSIS — R319 Hematuria, unspecified: Secondary | ICD-10-CM | POA: Diagnosis not present

## 2023-10-21 DIAGNOSIS — Z0000 Encounter for general adult medical examination without abnormal findings: Secondary | ICD-10-CM | POA: Diagnosis not present

## 2023-10-21 DIAGNOSIS — N182 Chronic kidney disease, stage 2 (mild): Secondary | ICD-10-CM

## 2023-10-21 DIAGNOSIS — R10A2 Flank pain, left side: Secondary | ICD-10-CM | POA: Diagnosis not present

## 2023-10-21 DIAGNOSIS — Z79899 Other long term (current) drug therapy: Secondary | ICD-10-CM

## 2023-10-21 DIAGNOSIS — Z13228 Encounter for screening for other metabolic disorders: Secondary | ICD-10-CM

## 2023-10-21 DIAGNOSIS — G8929 Other chronic pain: Secondary | ICD-10-CM

## 2023-10-21 DIAGNOSIS — Z2821 Immunization not carried out because of patient refusal: Secondary | ICD-10-CM

## 2023-10-21 DIAGNOSIS — M25569 Pain in unspecified knee: Secondary | ICD-10-CM

## 2023-10-21 DIAGNOSIS — E663 Overweight: Secondary | ICD-10-CM

## 2023-10-21 DIAGNOSIS — Z6825 Body mass index (BMI) 25.0-25.9, adult: Secondary | ICD-10-CM

## 2023-10-21 DIAGNOSIS — Z872 Personal history of diseases of the skin and subcutaneous tissue: Secondary | ICD-10-CM

## 2023-10-21 DIAGNOSIS — Z13220 Encounter for screening for lipoid disorders: Secondary | ICD-10-CM

## 2023-10-21 DIAGNOSIS — Z139 Encounter for screening, unspecified: Secondary | ICD-10-CM

## 2023-10-21 LAB — POCT URINALYSIS DIP (CLINITEK)
Bilirubin, UA: NEGATIVE
Glucose, UA: NEGATIVE mg/dL
Ketones, POC UA: NEGATIVE mg/dL
Leukocytes, UA: NEGATIVE
Nitrite, UA: NEGATIVE
POC PROTEIN,UA: NEGATIVE
Spec Grav, UA: 1.015 (ref 1.010–1.025)
Urobilinogen, UA: 0.2 U/dL
pH, UA: 6.5 (ref 5.0–8.0)

## 2023-10-21 NOTE — Progress Notes (Signed)
 LILLETTE Kristeen JINNY Gladis, CMA,acting as a Neurosurgeon for Gaines Ada, FNP.,have documented all relevant documentation on the behalf of Gaines Ada, FNP,as directed by  Gaines Ada, FNP while in the presence of Gaines Ada, FNP.  Subjective:    Patient ID: Kelly Suarez , female    DOB: Mar 22, 1975 , 48 y.o.   MRN: [id number]  Chief Complaint  Patient presents with   Annual Exam    Patient presents today for HM, Patient reports compliance with medication. Patient denies any chest pain, SOB, or headaches. Patient has no concerns today.       HPI  Discussed the use of AI scribe software for clinical note transcription with the patient, who gave verbal consent to proceed.  History of Present Illness Kelly Suarez is a 48 year old female who presents with bloating and weight gain.  She experiences significant bloating, particularly around her menstrual cycle, which makes her appear as if she is 'three months pregnant.' She is concerned about the weight gain, having never weighed 137 pounds before. Her concern is heightened by a friend's recent death from ovarian cancer. She has had her ovaries checked by her gynecologist.  She has not been exercising regularly and attributes part of her problem to this. Her diet is balanced with vegetables and carbohydrates, avoiding white bread and pasta, but includes rice. She previously took probiotics to improve gut health but stopped as they did not significantly help with the bloating.  No nausea, vomiting, stomach pain, excessive belching, or passing gas. She has had a Cologuard test but not a colonoscopy.  She experiences occasional pain in the left side, which she associates with inadequate water intake. An ultrasound of the kidneys showed no blockages.  She has a history of a breast cyst with thick liquid, which has been monitored for three years without changes. She performs monthly breast self-exams and has regular mammograms.   Past Medical History:   Diagnosis Date   Breast mass    Family history of heart failure 04/09/2023   Fibroid    X 5   Yeast infection      Family History  Problem Relation Age of Onset   Heart disease Mother        mitral valve prolapse   Asthma Mother    Heart failure Maternal Grandmother    Cancer Maternal Grandfather        bone    No current outpatient medications on file.   No Known Allergies    The patient states she uses none for birth control. Patient's last menstrual period was 09/24/2023.   Negative for Dysmenorrhea and Negative for Menorrhagia. Negative for: breast discharge, breast lump(s), breast pain and breast self exam. Associated symptoms include abnormal vaginal bleeding. Pertinent negatives include abnormal bleeding (hematology), anxiety, decreased libido, depression, difficulty falling sleep, dyspareunia, history of infertility, nocturia, sexual dysfunction, sleep disturbances, urinary incontinence, urinary urgency, vaginal discharge and vaginal itching. Diet regular; balanced with vegetables, meats, and carbs. The patient states her exercise level is none but admits to needing to exercise.    The patient's tobacco use is:  Social History   Tobacco Use  Smoking Status Never  Smokeless Tobacco Never  She has been exposed to passive smoke. The patient's alcohol use is:  Social History   Substance and Sexual Activity  Alcohol Use Yes   Alcohol/week: 1.0 standard drink of alcohol   Types: 1 Standard drinks or equivalent per week   Comment: social  Additional information: Last pap reports  earlier this year.  Review of Systems  Constitutional:  Negative for chills, fatigue and fever.  HENT:  Negative for congestion.   Respiratory:  Negative for cough, chest tightness and shortness of breath.   Cardiovascular:  Negative for chest pain, palpitations and leg swelling.  Gastrointestinal:  Negative for constipation, diarrhea and nausea.  Endocrine: Negative for polydipsia,  polyphagia and polyuria.  Genitourinary:  Positive for flank pain (left). Negative for decreased urine volume, difficulty urinating, dysuria and pelvic pain.  Neurological:  Negative for dizziness, weakness and light-headedness.     Today's Vitals   10/21/23 1014  BP: 110/60  Pulse: 85  Temp: 98.4 F (36.9 C)  TempSrc: Oral  Weight: 137 lb 6.4 oz (62.3 kg)  Height: 5' 2 (1.575 m)  PainSc: 0-No pain   Body mass index is 25.13 kg/m.  Wt Readings from Last 3 Encounters:  10/21/23 137 lb 6.4 oz (62.3 kg)  06/16/23 135 lb (61.2 kg)  04/09/23 132 lb 9.6 oz (60.1 kg)     Objective:  Physical Exam Vitals and nursing note reviewed.  Constitutional:      General: She is not in acute distress.    Appearance: Normal appearance. She is well-developed. She is obese.  HENT:     Head: Normocephalic and atraumatic.     Right Ear: Hearing, tympanic membrane, ear canal and external ear normal. There is no impacted cerumen.     Left Ear: Hearing, tympanic membrane, ear canal and external ear normal. There is no impacted cerumen.     Nose: Nose normal.     Mouth/Throat:     Mouth: Mucous membranes are moist.  Eyes:     General: Lids are normal.     Extraocular Movements: Extraocular movements intact.     Conjunctiva/sclera: Conjunctivae normal.     Pupils: Pupils are equal, round, and reactive to light.     Funduscopic exam:    Right eye: No papilledema.        Left eye: No papilledema.  Neck:     Thyroid : No thyroid  mass.     Vascular: No carotid bruit.  Cardiovascular:     Rate and Rhythm: Normal rate and regular rhythm.     Pulses: Normal pulses.     Heart sounds: Normal heart sounds. No murmur heard. Pulmonary:     Effort: Pulmonary effort is normal. No respiratory distress.     Breath sounds: Normal breath sounds. No wheezing.  Chest:     Chest wall: No mass.  Breasts:    Tanner Score is 5.     Right: Normal. No mass or tenderness.     Left: Normal. No mass or tenderness.   Abdominal:     General: Bowel sounds are normal. There is distension.     Palpations: Abdomen is soft.     Tenderness: There is no abdominal tenderness.  Genitourinary:    Rectum: Guaiac result negative.  Musculoskeletal:        General: No swelling. Normal range of motion.     Cervical back: Full passive range of motion without pain, normal range of motion and neck supple.     Right lower leg: No edema.     Left lower leg: No edema.  Lymphadenopathy:     Upper Body:     Right upper body: No supraclavicular, axillary or pectoral adenopathy.     Left upper body: No supraclavicular, axillary or pectoral adenopathy.  Skin:    General: Skin is warm and  dry.     Capillary Refill: Capillary refill takes less than 2 seconds.  Neurological:     General: No focal deficit present.     Mental Status: She is alert and oriented to person, place, and time.     Cranial Nerves: No cranial nerve deficit.     Sensory: No sensory deficit.  Psychiatric:        Mood and Affect: Mood normal.        Behavior: Behavior normal.        Thought Content: Thought content normal.        Judgment: Judgment normal.         Assessment And Plan:     Encounter for annual health examination Assessment & Plan: Routine wellness visit. Discussed health maintenance, diet, exercise, and reviewed Pap smear and mammogram results. - Obtain copy of recent Pap smear and mammogram results. - Screen for hepatitis B immunity with blood test.   CKD (chronic kidney disease), stage II Assessment & Plan: Stage 2 CKD with occasional left-sided pain, possibly hydration-related. Previous ultrasound showed no obstruction. - Encourage adequate hydration. - Obtain urine sample to rule out infection.  Orders: -     CMP14+EGFR  Influenza vaccination declined  Overweight with body mass index (BMI) of 25 to 25.9 in adult  Encounter for screening for metabolic disorder -     Hemoglobin A1c  Encounter for screening -      Hepatitis B surface antibody,qualitative  Abdominal bloating Assessment & Plan: Cyclical bloating linked to menstrual cycle.  - Refer to GI specialist for further evaluation. - Advise to monitor symptoms and maintain hydration.  Orders: -     Ambulatory referral to Gastroenterology  Other long term (current) drug therapy -     CBC with Differential/Platelet  Encounter for lipid screening for cardiovascular disease -     Lipid panel  Left flank pain -     POCT URINALYSIS DIP (CLINITEK)  Hematuria, unspecified type -     Urine Culture  Chronic knee pain, unspecified laterality Assessment & Plan: Knee pain with crepitus, indicative of early osteoarthritis. Discussed progressive nature and lifestyle modifications. - Encourage regular walking. - Consider glucosamine supplementation. - Advise on maintaining hydration.   History of cyst of breast Assessment & Plan: Breast cyst with thick liquid, stable for three years. - Continue regular breast self-examinations. - Monitor for changes in breast symptoms.       Return for 1 year physical. Patient was given opportunity to ask questions. Patient verbalized understanding of the plan and was able to repeat key elements of the plan. All questions were answered to their satisfaction.   Gaines Ada, FNP  I, Gaines Ada, FNP, have reviewed all documentation for this visit. The documentation on 10/21/23 for the exam, diagnosis, procedures, and orders are all accurate and complete.

## 2023-10-22 LAB — CBC WITH DIFFERENTIAL/PLATELET
Basophils Absolute: 0.1 x10E3/uL (ref 0.0–0.2)
Basos: 1 %
EOS (ABSOLUTE): 0.1 x10E3/uL (ref 0.0–0.4)
Eos: 3 %
Hematocrit: 44.1 % (ref 34.0–46.6)
Hemoglobin: 14.3 g/dL (ref 11.1–15.9)
Immature Grans (Abs): 0 x10E3/uL (ref 0.0–0.1)
Immature Granulocytes: 0 %
Lymphocytes Absolute: 1.2 x10E3/uL (ref 0.7–3.1)
Lymphs: 33 %
MCH: 31 pg (ref 26.6–33.0)
MCHC: 32.4 g/dL (ref 31.5–35.7)
MCV: 96 fL (ref 79–97)
Monocytes Absolute: 0.2 x10E3/uL (ref 0.1–0.9)
Monocytes: 7 %
Neutrophils Absolute: 2.1 x10E3/uL (ref 1.4–7.0)
Neutrophils: 56 %
Platelets: 267 x10E3/uL (ref 150–450)
RBC: 4.61 x10E6/uL (ref 3.77–5.28)
RDW: 12 % (ref 11.7–15.4)
WBC: 3.7 x10E3/uL (ref 3.4–10.8)

## 2023-10-22 LAB — CMP14+EGFR
ALT: 10 IU/L (ref 0–32)
AST: 12 IU/L (ref 0–40)
Albumin: 4.6 g/dL (ref 3.9–4.9)
Alkaline Phosphatase: 69 IU/L (ref 41–116)
BUN/Creatinine Ratio: 12 (ref 9–23)
BUN: 12 mg/dL (ref 6–24)
Bilirubin Total: 0.5 mg/dL (ref 0.0–1.2)
CO2: 23 mmol/L (ref 20–29)
Calcium: 9.8 mg/dL (ref 8.7–10.2)
Chloride: 99 mmol/L (ref 96–106)
Creatinine, Ser: 1.02 mg/dL — ABNORMAL HIGH (ref 0.57–1.00)
Globulin, Total: 2.5 g/dL (ref 1.5–4.5)
Glucose: 88 mg/dL (ref 70–99)
Potassium: 4.7 mmol/L (ref 3.5–5.2)
Sodium: 137 mmol/L (ref 134–144)
Total Protein: 7.1 g/dL (ref 6.0–8.5)
eGFR: 67 mL/min/1.73 (ref >59)

## 2023-10-22 LAB — LIPID PANEL
Chol/HDL Ratio: 2.9 ratio (ref 0.0–4.4)
Cholesterol, Total: 204 mg/dL — ABNORMAL HIGH (ref 100–199)
HDL: 71 mg/dL (ref >39)
LDL Chol Calc (NIH): 123 mg/dL — ABNORMAL HIGH (ref 0–99)
Triglycerides: 54 mg/dL (ref 0–149)
VLDL Cholesterol Cal: 10 mg/dL (ref 5–40)

## 2023-10-22 LAB — HEMOGLOBIN A1C
Est. average glucose Bld gHb Est-mCnc: 108 mg/dL
Hgb A1c MFr Bld: 5.4 % (ref 4.8–5.6)

## 2023-10-22 LAB — URINE CULTURE

## 2023-10-22 LAB — HEPATITIS B SURFACE ANTIBODY,QUALITATIVE: Hep B Surface Ab, Qual: NONREACTIVE

## 2023-11-01 ENCOUNTER — Ambulatory Visit: Payer: Self-pay | Admitting: Nurse Practitioner

## 2023-11-01 DIAGNOSIS — Z0000 Encounter for general adult medical examination without abnormal findings: Secondary | ICD-10-CM | POA: Insufficient documentation

## 2023-11-01 DIAGNOSIS — Z872 Personal history of diseases of the skin and subcutaneous tissue: Secondary | ICD-10-CM | POA: Insufficient documentation

## 2023-11-01 DIAGNOSIS — Z6825 Body mass index (BMI) 25.0-25.9, adult: Secondary | ICD-10-CM | POA: Insufficient documentation

## 2023-11-01 DIAGNOSIS — G8929 Other chronic pain: Secondary | ICD-10-CM | POA: Insufficient documentation

## 2023-11-01 NOTE — Assessment & Plan Note (Signed)
 Routine wellness visit. Discussed health maintenance, diet, exercise, and reviewed Pap smear and mammogram results. - Obtain copy of recent Pap smear and mammogram results. - Screen for hepatitis B immunity with blood test.

## 2023-11-01 NOTE — Assessment & Plan Note (Signed)
 Breast cyst with thick liquid, stable for three years. - Continue regular breast self-examinations. - Monitor for changes in breast symptoms.

## 2023-11-01 NOTE — Assessment & Plan Note (Signed)
 Knee pain with crepitus, indicative of early osteoarthritis. Discussed progressive nature and lifestyle modifications. - Encourage regular walking. - Consider glucosamine supplementation. - Advise on maintaining hydration.

## 2023-11-01 NOTE — Assessment & Plan Note (Signed)
 Stage 2 CKD with occasional left-sided pain, possibly hydration-related. Previous ultrasound showed no obstruction. - Encourage adequate hydration. - Obtain urine sample to rule out infection.

## 2023-11-01 NOTE — Assessment & Plan Note (Signed)
 Cyclical bloating linked to menstrual cycle.  - Refer to GI specialist for further evaluation. - Advise to monitor symptoms and maintain hydration.

## 2024-01-21 ENCOUNTER — Encounter: Payer: Self-pay | Admitting: Gastroenterology

## 2024-02-16 ENCOUNTER — Ambulatory Visit: Admitting: Gastroenterology

## 2024-03-10 ENCOUNTER — Ambulatory Visit: Admitting: Gastroenterology

## 2024-10-25 ENCOUNTER — Encounter: Payer: Self-pay | Admitting: Nurse Practitioner
